# Patient Record
Sex: Female | Born: 1970 | Race: White | Hispanic: No | Marital: Married | Smoking: Former smoker
Health system: Southern US, Community
[De-identification: ages and names within clinical notes are randomized; demographics above are authoritative.]

## PROBLEM LIST (undated history)

## (undated) DIAGNOSIS — T7840XA Allergy, unspecified, initial encounter: Secondary | ICD-10-CM

## (undated) HISTORY — DX: Allergy, unspecified, initial encounter: T78.40XA

## (undated) HISTORY — PX: VAGINAL HYSTERECTOMY: SHX2639

---

## 2004-08-08 ENCOUNTER — Ambulatory Visit: Payer: Self-pay | Admitting: Family Medicine

## 2012-03-12 ENCOUNTER — Ambulatory Visit: Payer: Self-pay | Admitting: Family Medicine

## 2012-04-06 ENCOUNTER — Ambulatory Visit: Payer: Self-pay | Admitting: Urology

## 2013-01-25 ENCOUNTER — Ambulatory Visit: Payer: Self-pay | Admitting: Orthopedic Surgery

## 2013-03-17 ENCOUNTER — Ambulatory Visit: Payer: Self-pay | Admitting: Physician Assistant

## 2013-04-07 ENCOUNTER — Ambulatory Visit: Payer: Self-pay | Admitting: Physician Assistant

## 2013-07-27 ENCOUNTER — Ambulatory Visit: Payer: Self-pay | Admitting: Anesthesiology

## 2013-08-01 ENCOUNTER — Ambulatory Visit: Payer: Self-pay | Admitting: Anesthesiology

## 2013-11-30 DIAGNOSIS — E782 Mixed hyperlipidemia: Secondary | ICD-10-CM | POA: Insufficient documentation

## 2013-11-30 DIAGNOSIS — R079 Chest pain, unspecified: Secondary | ICD-10-CM | POA: Insufficient documentation

## 2014-02-07 ENCOUNTER — Ambulatory Visit: Payer: Self-pay | Admitting: Anesthesiology

## 2014-03-26 IMAGING — MG MM ADDITIONAL VIEWS AT NO CHARGE
1 series · 2 of 2 positions shown · non-contrast
Comparison: 03/17/2013

REASON FOR EXAM: av lt asymmetry
COMMENTS:

PROCEDURE:     MAM - MAM DGTL ADD VW LT  SCR  - April 07, 2013  [DATE]
CLINICAL DATA: Possible left breast mass
EXAM:
MAMMOGRAPHIC ADDITIONAL VIEWS

[L CC · left · 2 of 2 slices shown]
[im 1/2]
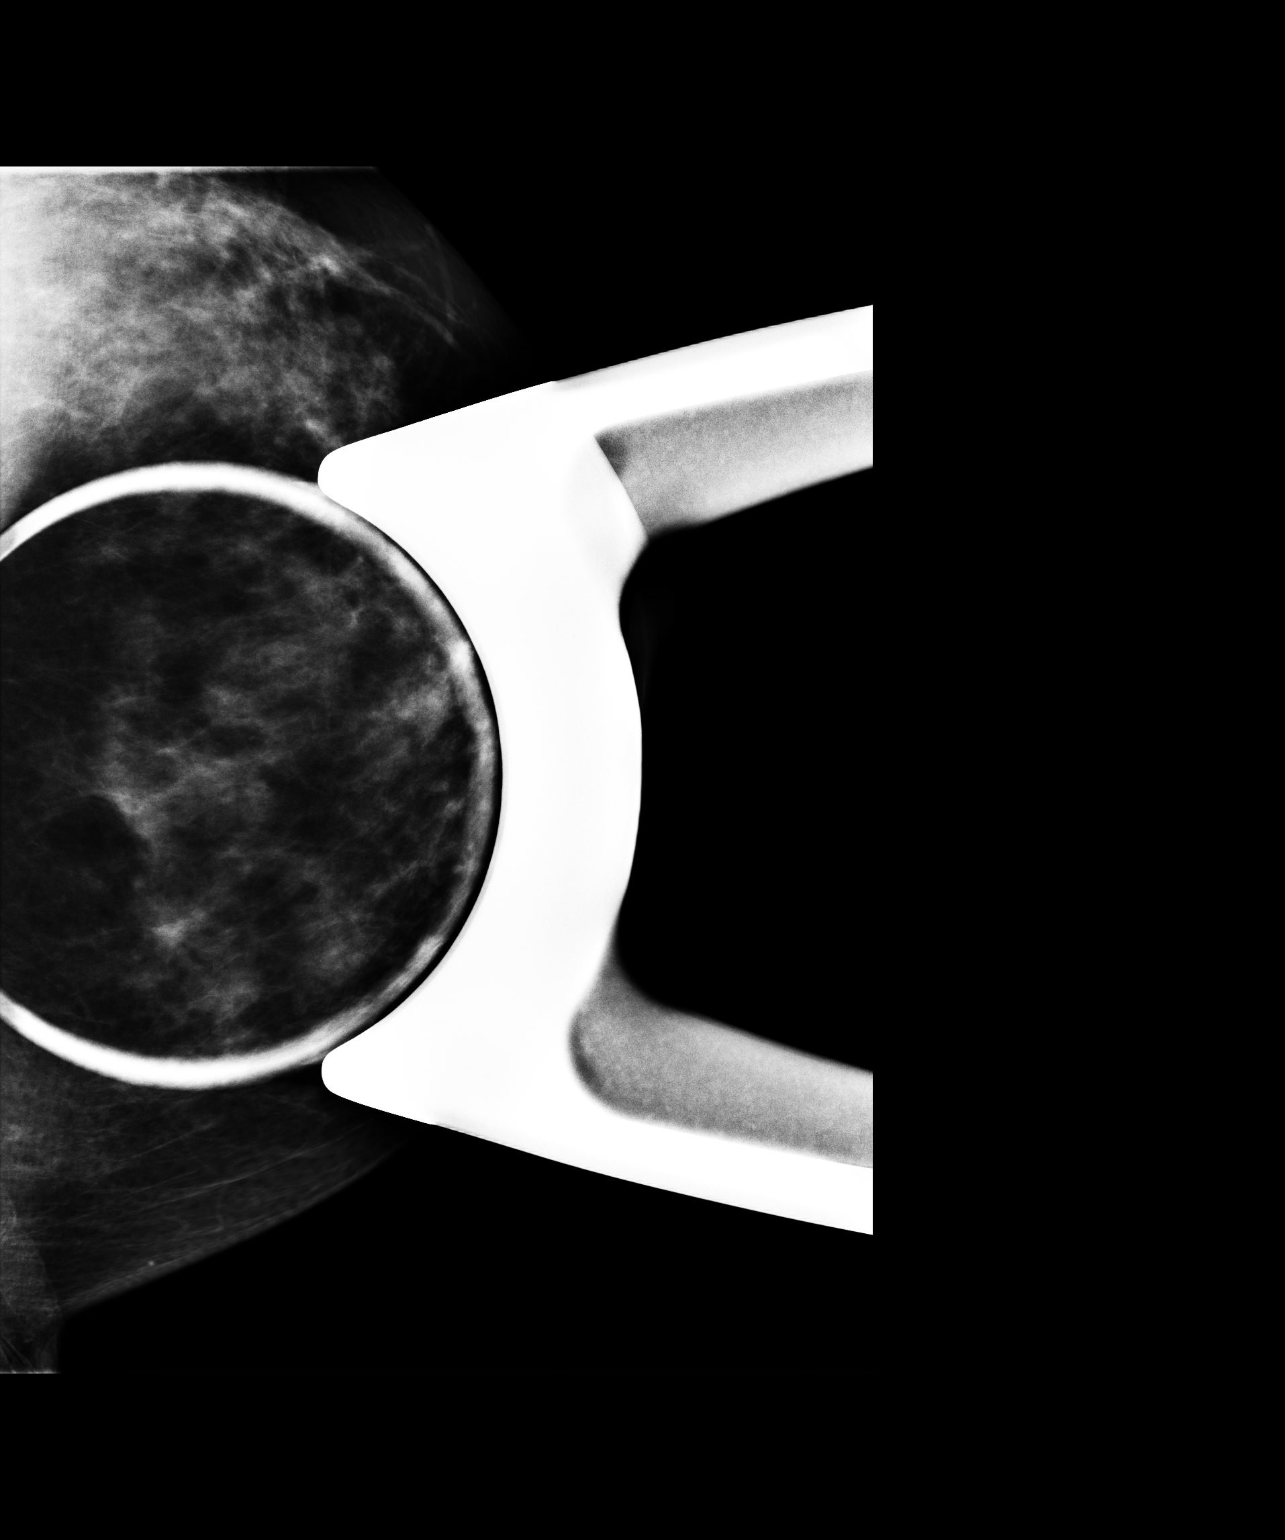
[im 2/2]
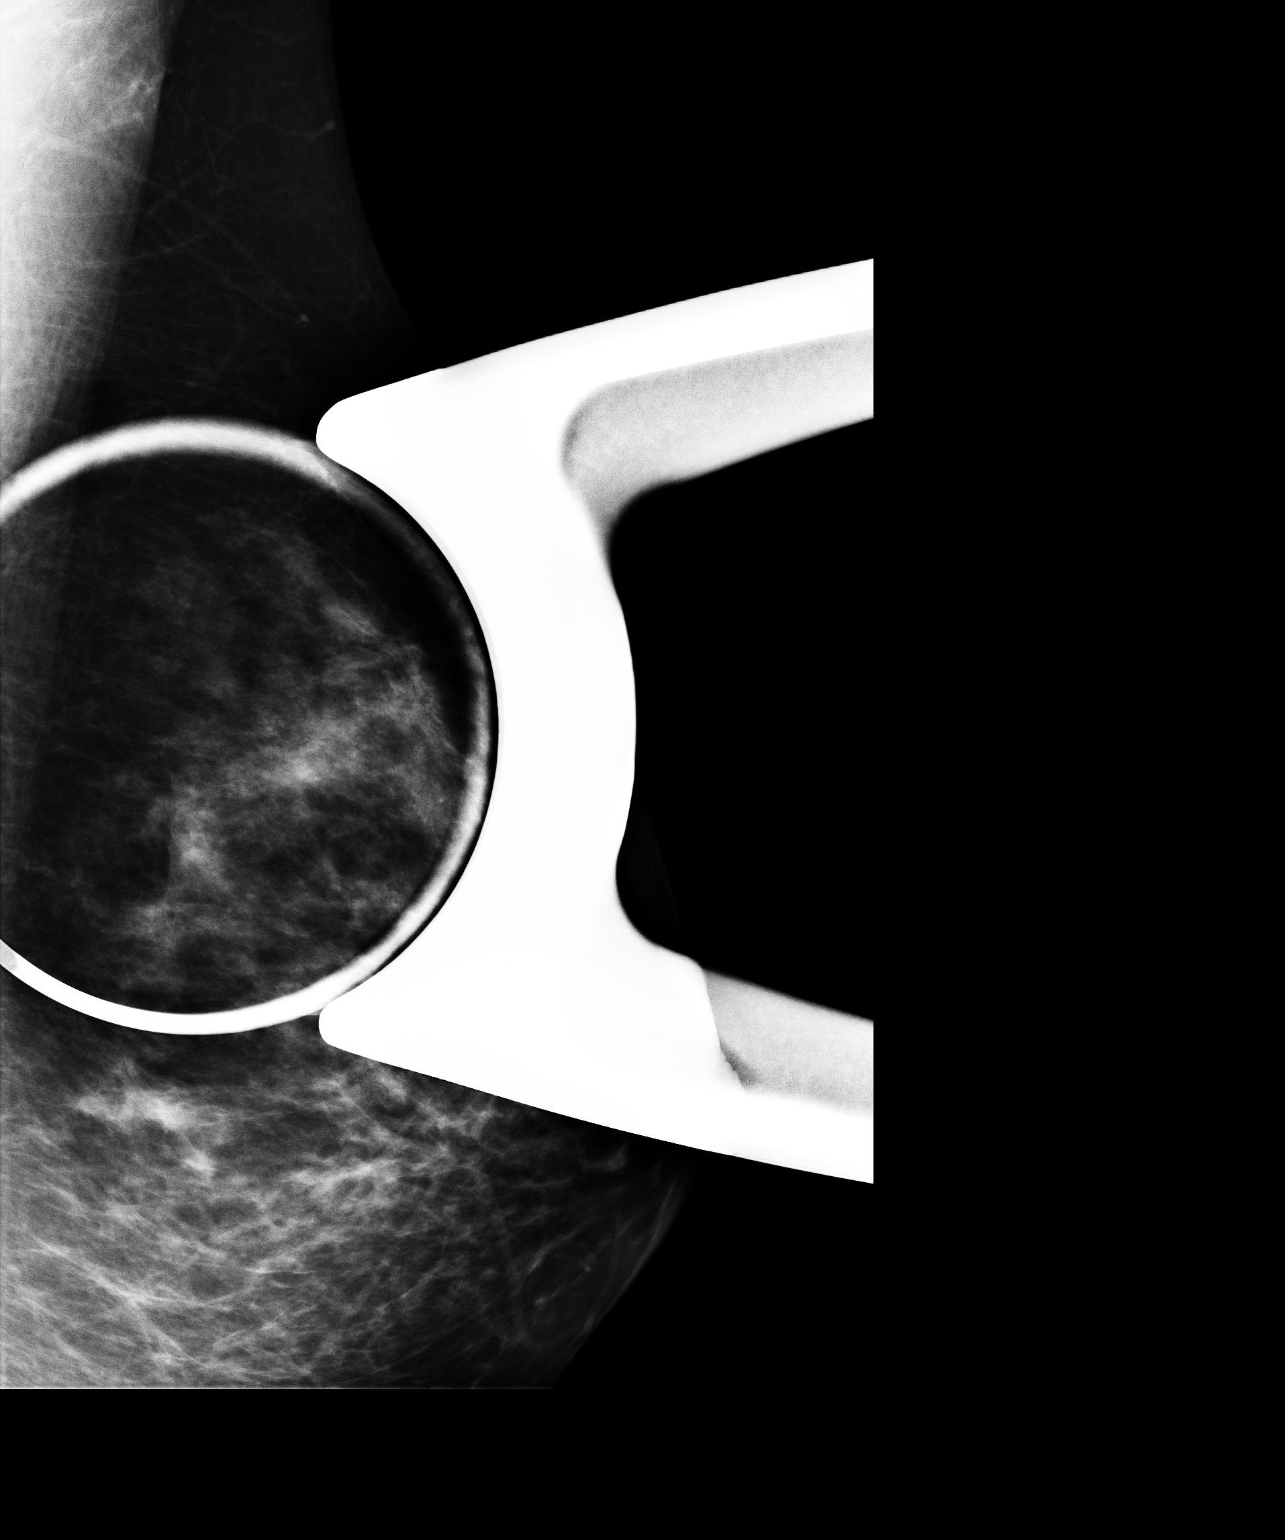

[2 of 2 positions shown; findings below may reference images not displayed]

ACR Breast Density Category c: The breast tissue is heterogeneously
dense, which may obscure small masses.
FINDINGS: No persistent abnormalities on spot compression views
IMPRESSION: Negative

RECOMMENDATION:
Screening mammogram in 1 year

I have discussed the findings and recommendations with the patient.
Results were also provided in writing at the conclusion of the
visit. If applicable, a reminder letter will be sent to the patient
regarding the next appointment.

BI-RADS CATEGORY  1: Negative

## 2014-07-25 ENCOUNTER — Ambulatory Visit: Payer: Self-pay | Admitting: Anesthesiology

## 2014-09-27 ENCOUNTER — Ambulatory Visit
Admit: 2014-09-27 | Disposition: A | Discharge status: Home / Self Care | Payer: Self-pay | Attending: Anesthesiology | Admitting: Anesthesiology
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
## 2014-10-18 ENCOUNTER — Other Ambulatory Visit: Payer: Self-pay | Admitting: Anesthesiology

## 2014-10-18 DIAGNOSIS — M79605 Pain in left leg: Secondary | ICD-10-CM

## 2014-10-18 DIAGNOSIS — M545 Low back pain: Secondary | ICD-10-CM

## 2014-10-18 DIAGNOSIS — M79604 Pain in right leg: Secondary | ICD-10-CM

## 2014-10-24 ENCOUNTER — Telehealth: Payer: Self-pay

## 2014-10-25 ENCOUNTER — Ambulatory Visit
Admission: RE | Admit: 2014-10-25 | Discharge: 2014-10-25 | Disposition: A | Discharge status: Home / Self Care | Payer: BLUE CROSS/BLUE SHIELD | Source: Ambulatory Visit | Attending: Anesthesiology | Admitting: Anesthesiology

## 2014-10-25 DIAGNOSIS — M5127 Other intervertebral disc displacement, lumbosacral region: Secondary | ICD-10-CM | POA: Insufficient documentation

## 2014-10-25 DIAGNOSIS — M545 Low back pain: Secondary | ICD-10-CM | POA: Diagnosis present

## 2014-10-25 DIAGNOSIS — M4696 Unspecified inflammatory spondylopathy, lumbar region: Secondary | ICD-10-CM | POA: Insufficient documentation

## 2014-10-25 DIAGNOSIS — M79605 Pain in left leg: Secondary | ICD-10-CM

## 2015-02-22 ENCOUNTER — Ambulatory Visit (INDEPENDENT_AMBULATORY_CARE_PROVIDER_SITE_OTHER): Payer: BLUE CROSS/BLUE SHIELD | Admitting: Family Medicine

## 2015-02-22 ENCOUNTER — Encounter: Payer: Self-pay | Admitting: Family Medicine

## 2015-02-22 VITALS — BP 120/62 | HR 80 | Ht 66.0 in | Wt 181.0 lb

## 2015-02-22 DIAGNOSIS — M519 Unspecified thoracic, thoracolumbar and lumbosacral intervertebral disc disorder: Secondary | ICD-10-CM

## 2015-02-22 DIAGNOSIS — M5442 Lumbago with sciatica, left side: Secondary | ICD-10-CM | POA: Diagnosis not present

## 2015-02-22 DIAGNOSIS — M4647 Discitis, unspecified, lumbosacral region: Secondary | ICD-10-CM

## 2015-02-22 MED ORDER — TRAMADOL HCL 50 MG PO TABS: 50.0000 mg | ORAL_TABLET | Freq: Three times a day (TID) | ORAL | Status: DC | PRN
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
## 2015-02-22 NOTE — Patient Instructions (Signed)

## 2015-04-27 ENCOUNTER — Encounter: Payer: Self-pay | Admitting: Family Medicine

## 2015-04-27 ENCOUNTER — Ambulatory Visit (INDEPENDENT_AMBULATORY_CARE_PROVIDER_SITE_OTHER): Payer: BLUE CROSS/BLUE SHIELD | Admitting: Family Medicine

## 2015-04-27 VITALS — BP 100/64 | HR 72 | Ht 66.0 in | Wt 176.0 lb

## 2015-04-27 DIAGNOSIS — J40 Bronchitis, not specified as acute or chronic: Secondary | ICD-10-CM

## 2015-04-27 DIAGNOSIS — J0100 Acute maxillary sinusitis, unspecified: Secondary | ICD-10-CM | POA: Diagnosis not present

## 2015-04-27 MED ORDER — AMOXICILLIN-POT CLAVULANATE 875-125 MG PO TABS: 1.0000 | ORAL_TABLET | Freq: Two times a day (BID) | ORAL | Status: DC

## 2015-04-27 MED ORDER — GUAIFENESIN-CODEINE 100-10 MG/5ML PO SOLN: 5.0000 mL | Freq: Three times a day (TID) | ORAL | Status: DC | PRN

## 2015-04-27 NOTE — Progress Notes (Signed)
Name: Ariana Martin   MRN: [id number]    DOB: [birth year]   Date:04/27/2015       Progress Note  Subjective  Chief Complaint  Chief Complaint  Patient presents with  . Sinusitis    cong, stuffy nose, coughing, chills, body aches    Sinusitis This is a new problem. The current episode started in the past 7 days. The problem has been gradually worsening since onset. The maximum temperature recorded prior to her arrival was 100.4 - 100.9 F. The pain is mild. Associated symptoms include chills, congestion, coughing, diaphoresis, headaches, a hoarse voice, neck pain, sinus pressure, sneezing, a sore throat and swollen glands. Pertinent negatives include no ear pain or shortness of breath. Past treatments include acetaminophen and oral decongestants. The treatment provided no relief.  Cough This is a new problem. The current episode started in the past 7 days. The problem has been gradually worsening. Associated symptoms include chills, ear congestion, a fever, headaches, myalgias, nasal congestion, postnasal drip, a sore throat and wheezing. Pertinent negatives include no chest pain, ear pain, heartburn, rash, shortness of breath or weight loss. She has tried OTC cough suppressant for the symptoms. The treatment provided no relief. There is no history of environmental allergies.    No problem-specific assessment & plan notes found for this encounter.   No past medical history on file.  Past Surgical History  Procedure Laterality Date  . Vaginal hysterectomy      No family history on file.  Social History   Social History  . Marital Status: Married    Spouse Name: N/A  . Number of Children: N/A  . Years of Education: N/A   Occupational History  . Not on file.   Social History Main Topics  . Smoking status: Current Every Day Smoker  . Smokeless tobacco: Not on file  . Alcohol Use: 0.0 oz/week    0 Standard drinks or equivalent per week  . Drug Use: No  . Sexual Activity: Not on  file   Other Topics Concern  . Not on file   Social History Narrative    Allergies  Allergen Reactions  . Sulfa Antibiotics Itching and Rash     Review of Systems  Constitutional: Positive for fever, chills and diaphoresis. Negative for weight loss and malaise/fatigue.  HENT: Positive for congestion, hoarse voice, postnasal drip, sinus pressure, sneezing and sore throat. Negative for ear discharge and ear pain.   Eyes: Negative for blurred vision.  Respiratory: Positive for cough and wheezing. Negative for sputum production and shortness of breath.   Cardiovascular: Negative for chest pain, palpitations and leg swelling.  Gastrointestinal: Negative for heartburn, nausea, abdominal pain, diarrhea, constipation, blood in stool and melena.  Genitourinary: Negative for dysuria, urgency, frequency and hematuria.  Musculoskeletal: Positive for myalgias and neck pain. Negative for back pain and joint pain.  Skin: Negative for rash.  Neurological: Positive for headaches. Negative for dizziness, tingling, sensory change and focal weakness.  Endo/Heme/Allergies: Negative for environmental allergies and polydipsia. Does not bruise/bleed easily.  Psychiatric/Behavioral: Negative for depression and suicidal ideas. The patient is not nervous/anxious and does not have insomnia.      Objective  Filed Vitals:   04/27/15 1531  BP: 100/64  Pulse: 72  Height: 5\' 6"  (1.676 m)  Weight: 176 lb (79.833 kg)    Physical Exam  Constitutional: She is well-developed, well-nourished, and in no distress. No distress.  HENT:  Head: Normocephalic and atraumatic.  Right Ear: External ear normal.  Left Ear: External ear normal.  Nose: Nose normal.  Mouth/Throat: Oropharynx is clear and moist.  Eyes: Conjunctivae and EOM are normal. Pupils are equal, round, and reactive to light. Right eye exhibits no discharge. Left eye exhibits no discharge.  Neck: Normal range of motion. Neck supple. No JVD present.  No thyromegaly present.  Cardiovascular: Normal rate, regular rhythm, normal heart sounds and intact distal pulses.  Exam reveals no gallop and no friction rub.   No murmur heard. Pulmonary/Chest: Effort normal and breath sounds normal.  Abdominal: Soft. Bowel sounds are normal. She exhibits no mass. There is no tenderness. There is no guarding.  Musculoskeletal: Normal range of motion. She exhibits no edema.  Lymphadenopathy:    She has no cervical adenopathy.  Neurological: She is alert.  Skin: Skin is warm and dry. She is not diaphoretic.  Psychiatric: Mood and affect normal.      Assessment & Plan  Problem List Items Addressed This Visit    None    Visit Diagnoses    Acute maxillary sinusitis, recurrence not specified    -  Primary    Relevant Medications    amoxicillin-clavulanate (AUGMENTIN) 875-125 MG tablet    guaiFENesin-codeine 100-10 MG/5ML syrup    Bronchitis             Dr. [facility]  04/27/2015

## 2015-11-08 ENCOUNTER — Encounter: Payer: Self-pay | Admitting: Family Medicine

## 2015-11-08 ENCOUNTER — Ambulatory Visit (INDEPENDENT_AMBULATORY_CARE_PROVIDER_SITE_OTHER): Payer: BLUE CROSS/BLUE SHIELD | Admitting: Family Medicine

## 2015-11-08 VITALS — BP 120/80 | HR 80 | Ht 66.0 in | Wt 180.0 lb

## 2015-11-08 DIAGNOSIS — M1990 Unspecified osteoarthritis, unspecified site: Secondary | ICD-10-CM

## 2015-11-08 DIAGNOSIS — M722 Plantar fascial fibromatosis: Secondary | ICD-10-CM | POA: Diagnosis not present

## 2015-11-08 MED ORDER — MELOXICAM 15 MG PO TABS: 15.0000 mg | ORAL_TABLET | Freq: Every day | ORAL | Status: DC

## 2015-11-08 NOTE — Patient Instructions (Signed)
Rheumatoid Arthritis  Rheumatoid arthritis is a long-term (chronic) inflammatory disease that causes pain, swelling, and stiffness of the joints. It can affect the entire body, including the eyes and lungs. The effects of rheumatoid arthritis vary widely among those with the condition.  CAUSES  The cause of rheumatoid arthritis is not known. It tends to run in families and is more common in women. Certain cells of the body's natural defense system (immune system) do not work properly and begin to attack healthy joints. It primarily involves the connective tissue that lines the joints (synovial membrane). This can cause damage to the joint.  SYMPTOMS  · Pain, stiffness, swelling, and decreased motion of many joints, especially in the hands and feet.  · Stiffness that is worse in the morning. It may last 1-2 hours or longer.  · Numbness and tingling in the hands.  · Fatigue.  · Loss of appetite.  · Weight loss.  · Low-grade fever.  · Dry eyes and mouth.  · Firm lumps (rheumatoid nodules) that grow beneath the skin in areas such as the elbows and hands.  DIAGNOSIS  Diagnosis is based on the symptoms described, an exam, and blood tests. Sometimes, X-rays are helpful.  TREATMENT  The goals of treatment are to relieve pain, reduce inflammation, and to slow down or stop joint damage and disability. Methods vary and may include:  · Maintaining a balance of rest, exercise, and proper nutrition.  · Your health care provider may adjust your medicines every 3 months until treatment goals are reached. Common medicines include:    Pain relievers (analgesics).    Corticosteroids and nonsteroidal anti-inflammatory drugs (NSAIDs) to reduce inflammation.    Disease-modifying antirheumatic drugs (DMARDs) to try to slow the course of the disease.    Biologic response modifiers to reduce inflammation and damage.  · Physical therapy and occupational therapy.  · Surgery for patients with severe joint damage. Joint replacement or fusing of  joints may be needed.  · Routine monitoring and ongoing care, such as office visits, blood and urine tests, and X-rays.  Your health care provider will work with you to identify the best treatment option for you, based on an assessment of the overall disease activity in your body.  HOME CARE INSTRUCTIONS  · Remain physically active and reduce activity when the disease gets worse.  · Eat a well-balanced diet.  · Put heat on affected joints when you wake up and before activities. Keep the heat on the affected joint for as long as directed by your health care provider.  · Put ice on affected joints following activities or exercising.    Put ice in a plastic bag.    Place a towel between your skin and the bag.    Leave the ice on for 15-20 minutes, 3-4 times per day, or as directed by your health care provider.  · Take medicines and supplements only as directed by your health care provider.  · Use splints as directed by your health care provider. Splints help maintain joint position and function.  · Do not sleep with pillows under your knees. This may lead to spasms.  · Participate in a self-management program to keep current with the latest treatment and coping skills.  SEEK IMMEDIATE MEDICAL CARE IF:  · You have fainting episodes.  · You have periods of extreme weakness.  · You rapidly develop a hot, painful joint that is more severe than usual joint aches.  · You have chills.  ·   Released: 05/30/2000 Document Revised: 06/23/2014 Document Reviewed: 07/09/2011 Elsevier Interactive Patient Education 2016 [employer] Plantar Fasciitis Plantar fasciitis is a painful foot condition that affects  the heel. It occurs when the band of tissue that connects the toes to the heel bone (plantar fascia) becomes irritated. This can happen after exercising too much or doing other repetitive activities (overuse injury). The pain from plantar fasciitis can range from mild irritation to severe pain that makes it difficult for you to walk or move. The pain is usually worse in the morning or after you have been sitting or lying down for a while. CAUSES This condition may be caused by:  Standing for long periods of time.  Wearing shoes that do not fit.  Doing high-impact activities, including running, aerobics, and ballet.  Being overweight.  Having an abnormal way of walking (gait).  Having tight calf muscles.  Having high arches in your feet.  Starting a new athletic activity. SYMPTOMS The main symptom of this condition is heel pain. Other symptoms include:  Pain that gets worse after activity or exercise.  Pain that is worse in the morning or after resting.  Pain that goes away after you walk for a few minutes. DIAGNOSIS This condition may be diagnosed based on your signs and symptoms. Your health care provider will also do a physical exam to check for:  A tender area on the bottom of your foot.  A high arch in your foot.  Pain when you move your foot.  Difficulty moving your foot. You may also need to have imaging studies to confirm the diagnosis. These can include:  X-rays.  Ultrasound.  MRI. TREATMENT  Treatment for plantar fasciitis depends on the severity of the condition. Your treatment may include:  Rest, ice, and over-the-counter pain medicines to manage your pain.  Exercises to stretch your calves and your plantar fascia.  A splint that holds your foot in a stretched, upward position while you sleep (night splint).  Physical therapy to relieve symptoms and prevent problems in the future.  Cortisone injections to relieve severe pain.  Extracorporeal shock  wave therapy (ESWT) to stimulate damaged plantar fascia with electrical impulses. It is often used as a last resort before surgery.  Surgery, if other treatments have not worked after 12 months. HOME CARE INSTRUCTIONS  Take medicines only as directed by your health care provider.  Avoid activities that cause pain.  Roll the bottom of your foot over a bag of ice or a bottle of cold water. Do this for 20 minutes, 3-4 times a day.  Perform simple stretches as directed by your health care provider.  Try wearing athletic shoes with air-sole or gel-sole cushions or soft shoe inserts.  Wear a night splint while sleeping, if directed by your health care provider.  Keep all follow-up appointments with your health care provider. PREVENTION   Do not perform exercises or activities that cause heel pain.  Consider finding low-impact activities if you continue to have problems.  Lose weight if you need to. The best way to prevent plantar fasciitis is to avoid the activities that aggravate your plantar fascia. SEEK MEDICAL CARE IF:  Your symptoms do not go away after treatment with home care measures.  Your pain gets worse.  Your pain affects your ability to move or do your daily activities.   This information is not intended to replace advice given to you by your health care provider. Make sure you discuss any questions you have with  your health care provider.   Document Released: 02/25/2001 Document Revised: 02/21/2015 Document Reviewed: 04/12/2014 Elsevier Interactive Patient Education [employer].

## 2015-11-08 NOTE — Progress Notes (Signed)
Name: Ariana Martin   MRN: [id number]    DOB: Feb 09, 1971   Date:11/08/2015       Progress Note  Subjective  Chief Complaint  Chief Complaint  Patient presents with  . Foot Pain    bilateral pain but worse on R) foot- hurts on bottom of heel and is worse when getting up in the morning  . Hand Pain    bilateral hand pain- has noticed "knots coming up on sides of fingers"    Foot Pain This is a new problem. The current episode started more than 1 month ago. The problem has been waxing and waning. Pertinent negatives include no abdominal pain, chest pain, chills, coughing, fatigue, fever, headaches, myalgias, nausea, neck pain, rash, sore throat or swollen glands. The symptoms are aggravated by standing and walking (esp AM). She has tried NSAIDs for the symptoms. The treatment provided mild relief.  Hand Pain  The incident occurred more than 1 week ago. There was no injury mechanism. The pain is present in the left fingers and right fingers. The quality of the pain is described as aching. The pain is moderate. The pain has been improving since the incident. Pertinent negatives include no chest pain or tingling. The symptoms are aggravated by movement. She has tried NSAIDs for the symptoms. The treatment provided mild relief.    No problem-specific assessment & plan notes found for this encounter.   History reviewed. No pertinent past medical history.  Past Surgical History  Procedure Laterality Date  . Vaginal hysterectomy      History reviewed. No pertinent family history.  Social History   Social History  . Marital Status: Married    Spouse Name: N/A  . Number of Children: N/A  . Years of Education: N/A   Occupational History  . Not on file.   Social History Main Topics  . Smoking status: Current Every Day Smoker  . Smokeless tobacco: Not on file  . Alcohol Use: 0.0 oz/week    0 Standard drinks or equivalent per week  . Drug Use: No  . Sexual Activity: Not on file    Other Topics Concern  . Not on file   Social History Narrative    Allergies  Allergen Reactions  . Sulfa Antibiotics Itching and Rash     Review of Systems  Constitutional: Negative for fever, chills, weight loss, malaise/fatigue and fatigue.  HENT: Negative for ear discharge, ear pain and sore throat.   Eyes: Negative for blurred vision.  Respiratory: Negative for cough, sputum production, shortness of breath and wheezing.   Cardiovascular: Negative for chest pain, palpitations and leg swelling.  Gastrointestinal: Negative for heartburn, nausea, abdominal pain, diarrhea, constipation, blood in stool and melena.  Genitourinary: Negative for dysuria, urgency, frequency and hematuria.  Musculoskeletal: Negative for myalgias, back pain, joint pain and neck pain.  Skin: Negative for rash.  Neurological: Negative for dizziness, tingling, sensory change, focal weakness and headaches.  Endo/Heme/Allergies: Negative for environmental allergies and polydipsia. Does not bruise/bleed easily.  Psychiatric/Behavioral: Negative for depression and suicidal ideas. The patient is not nervous/anxious and does not have insomnia.      Objective  Filed Vitals:   11/08/15 0817  BP: 120/80  Pulse: 80  Height: _0  (1.676 m)  Weight: 180 lb (81.647 kg)    Physical Exam  Constitutional: She is well-developed, well-nourished, and in no distress. No distress.  HENT:  Head: Normocephalic and atraumatic.  Right Ear: Tympanic membrane, external ear and ear canal normal.  Left Ear: Tympanic membrane, external ear and ear canal normal.  Nose: Nose normal.  Mouth/Throat: Oropharynx is clear and moist.  Eyes: Conjunctivae and EOM are normal. Pupils are equal, round, and reactive to light. Right eye exhibits no discharge. Left eye exhibits no discharge.  Neck: Normal range of motion. Neck supple. No JVD present. No thyromegaly present.  Cardiovascular: Normal rate, regular rhythm, normal heart  sounds and intact distal pulses.  Exam reveals no gallop and no friction rub.   No murmur heard. Pulmonary/Chest: Effort normal and breath sounds normal.  Abdominal: Soft. Bowel sounds are normal. She exhibits no mass. There is no tenderness. There is no guarding.  Musculoskeletal: Normal range of motion. She exhibits no edema.       Right wrist: She exhibits tenderness.       Left wrist: She exhibits tenderness.       Right hand: She exhibits tenderness and swelling.       Left hand: She exhibits tenderness and swelling.  nodularity  Lymphadenopathy:    She has no cervical adenopathy.  Neurological: She is alert. She has normal reflexes.  Skin: Skin is warm and dry. She is not diaphoretic.  Psychiatric: Mood and affect normal.  Nursing note and vitals reviewed.     Assessment & Plan  Problem List Items Addressed This Visit    None    Visit Diagnoses    Arthritis    -  Primary    Relevant Medications    meloxicam (MOBIC) 15 MG tablet    Other Relevant Orders    Sed Rate (ESR)    Rheumatoid Factor    Plantar fasciitis, bilateral        Relevant Medications    meloxicam (MOBIC) 15 MG tablet         Dr. Deanna Jones [facility]  11/08/2015

## 2015-11-09 LAB — SEDIMENTATION RATE: Sed Rate: 5 mm/hr (ref 0–32)

## 2015-11-09 LAB — RHEUMATOID FACTOR: Rhuematoid fact SerPl-aCnc: <10 IU/mL (ref 0.0–13.9)

## 2016-03-27 ENCOUNTER — Other Ambulatory Visit: Payer: Self-pay

## 2016-04-01 ENCOUNTER — Encounter: Payer: Self-pay | Admitting: Family Medicine

## 2016-04-01 ENCOUNTER — Ambulatory Visit (INDEPENDENT_AMBULATORY_CARE_PROVIDER_SITE_OTHER): Payer: BLUE CROSS/BLUE SHIELD | Admitting: Family Medicine

## 2016-04-01 VITALS — BP 118/70 | HR 60 | Ht 66.0 in | Wt 182.0 lb

## 2016-04-01 DIAGNOSIS — Z01818 Encounter for other preprocedural examination: Secondary | ICD-10-CM | POA: Diagnosis not present

## 2016-04-01 DIAGNOSIS — J029 Acute pharyngitis, unspecified: Secondary | ICD-10-CM

## 2016-04-01 DIAGNOSIS — K219 Gastro-esophageal reflux disease without esophagitis: Secondary | ICD-10-CM | POA: Diagnosis not present

## 2016-04-01 DIAGNOSIS — Z23 Encounter for immunization: Secondary | ICD-10-CM | POA: Diagnosis not present

## 2016-04-01 MED ORDER — AZITHROMYCIN 250 MG PO TABS: ORAL_TABLET | ORAL | 0 refills | Status: DC

## 2016-04-01 MED ORDER — PANTOPRAZOLE SODIUM 40 MG PO TBEC
40.0000 mg | DELAYED_RELEASE_TABLET | Freq: Every day | ORAL | 3 refills | Status: DC
Start: 2016-04-01 — End: 2016-08-02

## 2016-04-01 NOTE — Progress Notes (Signed)
Name: Ariana Martin   MRN: [id number]    DOB: Dec 20, 1970   Date:04/01/2016       Progress Note  Subjective  Chief Complaint  Chief Complaint  Patient presents with  . Pre-op Exam    no palpatations or chest pain. surgical clearance for back.    Patient presents for surgical clearance.    No problem-specific Assessment & Plan notes found for this encounter.   History reviewed. No pertinent past medical history.  Past Surgical History:  Procedure Laterality Date  . VAGINAL HYSTERECTOMY      History reviewed. No pertinent family history.  Social History   Social History  . Marital status: Married    Spouse name: N/A  . Number of children: N/A  . Years of education: N/A   Occupational History  . Not on file.   Social History Main Topics  . Smoking status: Current Every Day Smoker  . Smokeless tobacco: Not on file  . Alcohol use 0.0 oz/week  . Drug use: No  . Sexual activity: Not on file   Other Topics Concern  . Not on file   Social History Narrative  . No narrative on file    Allergies  Allergen Reactions  . Sulfa Antibiotics Itching and Rash     Review of Systems  Constitutional: Negative for chills, fever, malaise/fatigue and weight loss.  HENT: Positive for sore throat. Negative for ear discharge and ear pain.   Eyes: Negative for blurred vision.  Respiratory: Negative for cough, sputum production, shortness of breath and wheezing.   Cardiovascular: Negative for chest pain, palpitations and leg swelling.  Gastrointestinal: Positive for heartburn. Negative for abdominal pain, blood in stool, constipation, diarrhea, melena and nausea.  Genitourinary: Negative for dysuria, frequency, hematuria and urgency.  Musculoskeletal: Negative for back pain, joint pain, myalgias and neck pain.  Skin: Negative for rash.  Neurological: Negative for dizziness, tingling, sensory change, focal weakness and headaches.  Endo/Heme/Allergies: Negative for environmental  allergies and polydipsia. Does not bruise/bleed easily.  Psychiatric/Behavioral: Negative for depression and suicidal ideas. The patient is not nervous/anxious and does not have insomnia.      Objective  Vitals:   04/01/16 1536  BP: 118/70  Pulse: 60  Weight: 182 lb (82.6 kg)  Height: 5\' 6"  (1.676 m)    Physical Exam  Constitutional: She is well-developed, well-nourished, and in no distress. No distress.  HENT:  Head: Normocephalic and atraumatic.  Right Ear: External ear normal.  Left Ear: External ear normal.  Nose: Nose normal.  Mouth/Throat: Oropharynx is clear and moist.  Eyes: Conjunctivae and EOM are normal. Pupils are equal, round, and reactive to light. Right eye exhibits no discharge. Left eye exhibits no discharge.  Neck: Normal range of motion. Neck supple. No JVD present. No thyromegaly present.  Cardiovascular: Normal rate, regular rhythm, normal heart sounds and intact distal pulses.  Exam reveals no gallop and no friction rub.   No murmur heard. Pulmonary/Chest: Effort normal and breath sounds normal. She has no wheezes. She has no rales.  Abdominal: Soft. Bowel sounds are normal. She exhibits no mass. There is no tenderness. There is no guarding.  Musculoskeletal: Normal range of motion. She exhibits no edema.  Lymphadenopathy:    She has no cervical adenopathy.  Neurological: She is alert. She has normal reflexes.  Skin: Skin is warm and dry. She is not diaphoretic.  Psychiatric: Mood and affect normal.  Nursing note and vitals reviewed.     Assessment & Plan  Problem List Items Addressed This Visit    None    Visit Diagnoses    Preoperative clearance    -  Primary   medications reviewed/medical conditions reviewed/ no contraindications to anesthesia nor lumbar spine surgery.   Pharyngitis, unspecified etiology       Gastroesophageal reflux disease, esophagitis presence not specified       Relevant Medications   pantoprazole (PROTONIX) 40 MG tablet    Flu vaccine need       Relevant Orders   Flu Vaccine QUAD 36+ mos PF IM (Fluarix & Fluzone Quad PF) (Completed)     Proceed with surgery without concerns   Dr. Hayden [facility]  04/01/16

## 2016-08-02 ENCOUNTER — Other Ambulatory Visit: Payer: Self-pay | Admitting: Family Medicine

## 2016-08-29 ENCOUNTER — Encounter: Payer: Self-pay | Admitting: Podiatry

## 2016-09-18 NOTE — Progress Notes (Signed)
This encounter was created in error - please disregard.

## 2016-10-11 ENCOUNTER — Other Ambulatory Visit: Payer: Self-pay | Admitting: Family Medicine

## 2016-11-07 ENCOUNTER — Other Ambulatory Visit: Payer: Self-pay

## 2016-11-07 DIAGNOSIS — K219 Gastro-esophageal reflux disease without esophagitis: Secondary | ICD-10-CM

## 2016-11-07 MED ORDER — PANTOPRAZOLE SODIUM 40 MG PO TBEC: 40.0000 mg | DELAYED_RELEASE_TABLET | Freq: Every day | ORAL | 0 refills | Status: AC

## 2016-11-09 ENCOUNTER — Other Ambulatory Visit: Payer: Self-pay | Admitting: Family Medicine

## 2016-11-20 ENCOUNTER — Other Ambulatory Visit: Payer: Self-pay

## 2017-01-14 DIAGNOSIS — Z1231 Encounter for screening mammogram for malignant neoplasm of breast: Secondary | ICD-10-CM | POA: Diagnosis not present

## 2017-01-14 DIAGNOSIS — Z9289 Personal history of other medical treatment: Secondary | ICD-10-CM | POA: Diagnosis not present

## 2017-01-14 DIAGNOSIS — N649 Disorder of breast, unspecified: Secondary | ICD-10-CM | POA: Diagnosis not present

## 2017-01-28 DIAGNOSIS — R928 Other abnormal and inconclusive findings on diagnostic imaging of breast: Secondary | ICD-10-CM | POA: Diagnosis not present

## 2017-01-28 DIAGNOSIS — N6311 Unspecified lump in the right breast, upper outer quadrant: Secondary | ICD-10-CM | POA: Diagnosis not present

## 2017-02-05 DIAGNOSIS — M5137 Other intervertebral disc degeneration, lumbosacral region: Secondary | ICD-10-CM | POA: Diagnosis not present

## 2017-02-05 DIAGNOSIS — M5136 Other intervertebral disc degeneration, lumbar region: Secondary | ICD-10-CM | POA: Diagnosis not present

## 2017-02-05 DIAGNOSIS — Z981 Arthrodesis status: Secondary | ICD-10-CM | POA: Diagnosis not present

## 2017-06-04 DIAGNOSIS — M5137 Other intervertebral disc degeneration, lumbosacral region: Secondary | ICD-10-CM | POA: Diagnosis not present

## 2017-06-04 DIAGNOSIS — M4186 Other forms of scoliosis, lumbar region: Secondary | ICD-10-CM | POA: Diagnosis not present

## 2017-06-04 DIAGNOSIS — M5136 Other intervertebral disc degeneration, lumbar region: Secondary | ICD-10-CM | POA: Diagnosis not present

## 2017-06-04 DIAGNOSIS — Z981 Arthrodesis status: Secondary | ICD-10-CM | POA: Diagnosis not present

## 2017-07-16 DIAGNOSIS — R208 Other disturbances of skin sensation: Secondary | ICD-10-CM | POA: Diagnosis not present

## 2017-07-16 DIAGNOSIS — I83893 Varicose veins of bilateral lower extremities with other complications: Secondary | ICD-10-CM | POA: Diagnosis not present

## 2017-08-18 ENCOUNTER — Other Ambulatory Visit (INDEPENDENT_AMBULATORY_CARE_PROVIDER_SITE_OTHER): Payer: Self-pay | Admitting: Vascular Surgery

## 2017-08-18 DIAGNOSIS — I8312 Varicose veins of left lower extremity with inflammation: Secondary | ICD-10-CM

## 2017-08-20 ENCOUNTER — Ambulatory Visit (INDEPENDENT_AMBULATORY_CARE_PROVIDER_SITE_OTHER): Payer: BLUE CROSS/BLUE SHIELD

## 2017-08-20 ENCOUNTER — Encounter (INDEPENDENT_AMBULATORY_CARE_PROVIDER_SITE_OTHER): Payer: Self-pay | Admitting: Vascular Surgery

## 2017-08-20 ENCOUNTER — Ambulatory Visit (INDEPENDENT_AMBULATORY_CARE_PROVIDER_SITE_OTHER): Payer: BLUE CROSS/BLUE SHIELD | Admitting: Vascular Surgery

## 2017-08-20 VITALS — BP 112/65 | HR 62 | Resp 18 | Ht 66.0 in | Wt 173.0 lb

## 2017-08-20 DIAGNOSIS — I8312 Varicose veins of left lower extremity with inflammation: Secondary | ICD-10-CM

## 2017-08-20 DIAGNOSIS — I872 Venous insufficiency (chronic) (peripheral): Secondary | ICD-10-CM | POA: Diagnosis not present

## 2017-08-20 DIAGNOSIS — E782 Mixed hyperlipidemia: Secondary | ICD-10-CM | POA: Diagnosis not present

## 2017-08-20 DIAGNOSIS — M519 Unspecified thoracic, thoracolumbar and lumbosacral intervertebral disc disorder: Secondary | ICD-10-CM

## 2017-08-20 DIAGNOSIS — I83813 Varicose veins of bilateral lower extremities with pain: Secondary | ICD-10-CM | POA: Diagnosis not present

## 2017-08-20 NOTE — Progress Notes (Signed)
MRN : [id number]  Ariana Martin is a 47 y.o. (1971/02/28) female who presents with chief complaint of  Chief Complaint  Patient presents with  . New Patient (Initial Visit)    ref Gwen PoundsKowalski for Varicose veins  .  History of Present Illness: The patient is seen for evaluation of symptomatic varicose veins. The patient relates burning and stinging which worsened steadily throughout the course of the day, particularly with standing. The patient also notes an aching and throbbing pain over the varicosities, particularly with prolonged dependent positions. The symptoms are significantly improved with elevation.  The patient also notes that during hot weather the symptoms are greatly intensified. The patient states the pain from the varicose veins interferes with work, daily exercise, shopping and household maintenance. At this point, the symptoms are persistent and severe enough that they're having a negative impact on lifestyle and are interfering with daily activities.  There is no history of DVT, PE or superficial thrombophlebitis. There is no history of ulceration or hemorrhage. The patient denies a significant family history of varicose veins. OB history: P2  The patient has not worn graduated compression in the past. At the present time the patient has not been using over-the-counter analgesics. There is no history of prior surgical intervention or sclerotherapy.    Current Meds  Medication Sig  . Cetirizine HCl (ZYRTEC ALLERGY) 10 MG CAPS Take by mouth.  . EPINEPHrine 0.3 mg/0.3 mL IJ SOAJ injection Inject 0.3 mg into the muscle.  . naproxen sodium (ALEVE) 220 MG tablet Take by mouth.    Past Medical History:  Diagnosis Date  . Allergy     Past Surgical History:  Procedure Laterality Date  . VAGINAL HYSTERECTOMY      Social History Social History   Tobacco Use  . Smoking status: Current Every Day Smoker  . Smokeless tobacco: Never Used  Substance Use Topics  . Alcohol  use: Yes    Alcohol/week: 0.0 oz  . Drug use: No    Family History Family History  Problem Relation Age of Onset  . Hypertension Mother   . Hyperlipidemia Mother   . Vascular Disease Father   . Heart disease Father     Allergies  Allergen Reactions  . Sulfa Antibiotics Itching and Rash     REVIEW OF SYSTEMS (Negative unless checked)  Constitutional: [] Weight loss  [] Fever  [] Chills Cardiac: [] Chest pain   [] Chest pressure   [] Palpitations   [] Shortness of breath when laying flat   [] Shortness of breath with exertion. Vascular:  [] Pain in legs with walking   [x] Pain in legs with standing  [] History of DVT   [] Phlebitis   [] Swelling in legs   [x] Varicose veins   [] Non-healing ulcers Pulmonary:   [] Uses home oxygen   [] Productive cough   [] Hemoptysis   [] Wheeze  [] COPD   [] Asthma Neurologic:  [] Dizziness   [] Seizures   [] History of stroke   [] History of TIA  [] Aphasia   [] Vissual changes   [] Weakness or numbness in arm   [] Weakness or numbness in leg Musculoskeletal:   [] Joint swelling   [] Joint pain   [x] Low back pain Hematologic:  [] Easy bruising  [] Easy bleeding   [] Hypercoagulable state   [] Anemic Gastrointestinal:  [] Diarrhea   [] Vomiting  [] Gastroesophageal reflux/heartburn   [] Difficulty swallowing. Genitourinary:  [] Chronic kidney disease   [] Difficult urination  [] Frequent urination   [] Blood in urine Skin:  [] Rashes   [] Ulcers  Psychological:  [] History of anxiety   []  History  of major depression.  Physical Examination  Vitals:   08/20/17 1156  BP: 112/65  Pulse: 62  Resp: 18  Weight: 173 lb (78.5 kg)  Height: 5\' 6"  (1.676 m)   Body mass index is 27.92 kg/m. Gen: WD/WN, NAD Head: [place]/AT, No temporalis wasting.  Ear/Nose/Throat: Hearing grossly intact, nares w/o erythema or drainage Eyes: PER, EOMI, sclera nonicteric.  Neck: Supple, no large masses.   Pulmonary:  Good air movement, no audible wheezing bilaterally, no use of accessory muscles.  Cardiac: RRR, no  JVD Vascular: scattered varicosities present bilaterally.  Mild venous stasis changes to the legs bilaterally.  trace soft pitting edema Vessel Right Left  Radial Palpable Palpable  PT Palpable Palpable  DP Palpable Palpable  Gastrointestinal: Non-distended. No guarding/no peritoneal signs.  Musculoskeletal: M/S 5/5 throughout.  No deformity or atrophy.  Neurologic: CN 2-12 intact. Symmetrical.  Speech is fluent. Motor exam as listed above. Psychiatric: Judgment intact, Mood & affect appropriate for pt's clinical situation. Dermatologic: Mild venous rashes no ulcers noted.  No changes consistent with cellulitis. Lymph : No lichenification or skin changes of chronic lymphedema.  CBC No results found for: WBC, HGB, HCT, MCV, PLT  BMET No results found for: NA, K, CL, CO2, GLUCOSE, BUN, CREATININE, CALCIUM, GFRNONAA, GFRAA CrCl cannot be calculated (No order found.).  COAG No results found for: INR, PROTIME  Radiology No results found.  Assessment/Plan 1. Varicose veins of both lower extremities with pain  Recommend:  The patient has large symptomatic varicose veins that are painful and associated with swelling.  I have had a long discussion with the patient regarding  varicose veins and why they cause symptoms.  Patient will begin wearing graduated compression stockings class 1 on a daily basis, beginning first thing in the morning and removing them in the evening. The patient is instructed specifically not to sleep in the stockings.    The patient  will also begin using over-the-counter analgesics such as Motrin 600 mg po TID to help control the symptoms.    In addition, behavioral modification including elevation during the day will be initiated.    Pending the results of these changes the  patient will be reevaluated in three months.     Ultrasound of the venous system shows reflux in the GSV bilateral with the GSV measuring 6-7 mm, mild reflux in the right SSV but the vein  is only 3 mm.   Further plans will be based on the ultrasound results and whether conservative therapies are successful at eliminating the pain and swelling.   2. Chronic venous insufficiency No surgery or intervention at this point in time.    I have had a long discussion with the patient regarding venous insufficiency and why it  causes symptoms. I have discussed with the patient the chronic skin changes that accompany venous insufficiency and the long term sequela such as infection and ulceration.  Patient will begin wearing graduated compression stockings class 1 (20-30 mmHg) or compression wraps on a daily basis a prescription was given. The patient will put the stockings on first thing in the morning and removing them in the evening. The patient is instructed specifically not to sleep in the stockings.    In addition, behavioral modification including several periods of elevation of the lower extremities during the day will be continued. I have demonstrated that proper elevation is a position with the ankles at heart level.  The patient is instructed to begin routine exercise, especially walking on a  daily basis  3. Lumbosacral disc disease Recommend:  I do not find evidence of Vascular pathology that would explain all of  the patient's symptoms  The patient has atypical pain symptoms for pure vascular disease  Noninvasive studies including venous ultrasound of the legs show reflux in the GSV bilaterally  The patient should continue walking and begin a more formal exercise program. The patient should continue his antiplatelet therapy and aggressive treatment of the lipid abnormalities. The patient should begin wearing graduated compression socks 15-20 mmHg strength to control her mild edema.  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

## 2017-09-02 DIAGNOSIS — N950 Postmenopausal bleeding: Secondary | ICD-10-CM | POA: Diagnosis not present

## 2017-09-30 DIAGNOSIS — N950 Postmenopausal bleeding: Secondary | ICD-10-CM | POA: Diagnosis not present

## 2017-10-15 DIAGNOSIS — M5136 Other intervertebral disc degeneration, lumbar region: Secondary | ICD-10-CM | POA: Diagnosis not present

## 2017-10-15 DIAGNOSIS — Z981 Arthrodesis status: Secondary | ICD-10-CM | POA: Diagnosis not present

## 2017-11-26 ENCOUNTER — Ambulatory Visit (INDEPENDENT_AMBULATORY_CARE_PROVIDER_SITE_OTHER): Payer: BLUE CROSS/BLUE SHIELD | Admitting: Vascular Surgery

## 2017-11-26 ENCOUNTER — Encounter (INDEPENDENT_AMBULATORY_CARE_PROVIDER_SITE_OTHER): Payer: Self-pay | Admitting: Vascular Surgery

## 2017-11-26 VITALS — BP 130/69 | HR 74 | Resp 13 | Ht 66.0 in | Wt 171.0 lb

## 2017-11-26 DIAGNOSIS — I83813 Varicose veins of bilateral lower extremities with pain: Secondary | ICD-10-CM | POA: Diagnosis not present

## 2017-11-26 DIAGNOSIS — I872 Venous insufficiency (chronic) (peripheral): Secondary | ICD-10-CM

## 2017-11-26 DIAGNOSIS — E782 Mixed hyperlipidemia: Secondary | ICD-10-CM

## 2017-11-27 ENCOUNTER — Encounter (INDEPENDENT_AMBULATORY_CARE_PROVIDER_SITE_OTHER): Payer: Self-pay | Admitting: Vascular Surgery

## 2017-11-27 NOTE — Progress Notes (Signed)
MRN : [id number]  Ariana Martin is a 47 y.o. (12/16/1970) female who presents with chief complaint of  Chief Complaint  Patient presents with  . Follow-up    3 month no studies  .  History of Present Illness: The patient returns for followup evaluation 3 months after the initial visit. The patient continues to have pain in the lower extremities with dependency. The pain is lessened with elevation. Graduated compression stockings, Class I (20-30 mmHg), have been worn but the stockings do not eliminate the leg pain. Over-the-counter analgesics do not improve the symptoms. The degree of discomfort continues to interfere with daily activities. The patient notes the pain in the legs is causing problems with daily exercise, at the workplace and even with household activities and maintenance such as standing in the kitchen preparing meals and doing dishes.   Venous ultrasound shows normal deep venous system, no evidence of acute or chronic DVT.  Superficial reflux is present in the bilateral GSV and the right SSV  Current Meds  Medication Sig  . Cetirizine HCl (ZYRTEC ALLERGY) 10 MG CAPS Take by mouth.  . naproxen sodium (ALEVE) 220 MG tablet Take by mouth.  . phentermine (ADIPEX-P) 37.5 MG tablet TAKE 1 TO 1 AND 1/2 TABLETS EVERY MORNING    Past Medical History:  Diagnosis Date  . Allergy     Past Surgical History:  Procedure Laterality Date  . VAGINAL HYSTERECTOMY      Social History Social History   Tobacco Use  . Smoking status: Current Some Day Smoker  . Smokeless tobacco: Never Used  Substance Use Topics  . Alcohol use: Yes    Alcohol/week: 0.0 oz  . Drug use: No    Family History Family History  Problem Relation Age of Onset  . Hypertension Mother   . Hyperlipidemia Mother   . Vascular Disease Father   . Heart disease Father     Allergies  Allergen Reactions  . Sulfa Antibiotics Itching and Rash     REVIEW OF SYSTEMS (Negative unless  checked)  Constitutional: [] Weight loss  [] Fever  [] Chills Cardiac: [] Chest pain   [] Chest pressure   [] Palpitations   [] Shortness of breath when laying flat   [] Shortness of breath with exertion. Vascular:  [] Pain in legs with walking   [x] Pain in legs at rest  [] History of DVT   [] Phlebitis   [x] Swelling in legs   [x] Varicose veins   [] Non-healing ulcers Pulmonary:   [] Uses home oxygen   [] Productive cough   [] Hemoptysis   [] Wheeze  [] COPD   [] Asthma Neurologic:  [] Dizziness   [] Seizures   [] History of stroke   [] History of TIA  [] Aphasia   [] Vissual changes   [] Weakness or numbness in arm   [] Weakness or numbness in leg Musculoskeletal:   [] Joint swelling   [] Joint pain   [] Low back pain Hematologic:  [] Easy bruising  [] Easy bleeding   [] Hypercoagulable state   [] Anemic Gastrointestinal:  [] Diarrhea   [] Vomiting  [] Gastroesophageal reflux/heartburn   [] Difficulty swallowing. Genitourinary:  [] Chronic kidney disease   [] Difficult urination  [] Frequent urination   [] Blood in urine Skin:  [] Rashes   [] Ulcers  Psychological:  [] History of anxiety   []  History of major depression.  Physical Examination  Vitals:   11/26/17 1622  BP: 130/69  Pulse: 74  Resp: 13  Weight: 171 lb (77.6 kg)  Height: 5\' 6"  (1.676 m)   Body mass index is 27.6 kg/m. Gen: WD/WN, NAD Head: [place]/AT, No temporalis  wasting.  Ear/Nose/Throat: Hearing grossly intact, nares w/o erythema or drainage Eyes: PER, EOMI, sclera nonicteric.  Neck: Supple, no large masses.   Pulmonary:  Good air movement, no audible wheezing bilaterally, no use of accessory muscles.  Cardiac: RRR, no JVD Vascular: Large varicosities present extensively greater than 10 mm bilaterally.  Mild venous stasis changes to the legs bilaterally.  2+ soft pitting edema Vessel Right Left  Radial Palpable Palpable  PT Palpable Palpable  DP Palpable Palpable  Gastrointestinal: Non-distended. No guarding/no peritoneal signs.  Musculoskeletal: M/S 5/5  throughout.  No deformity or atrophy.  Neurologic: CN 2-12 intact. Symmetrical.  Speech is fluent. Motor exam as listed above. Psychiatric: Judgment intact, Mood & affect appropriate for pt's clinical situation. Dermatologic: mild venous rashes no ulcers noted.  No changes consistent with cellulitis. Lymph : No lichenification or skin changes of chronic lymphedema.  CBC No results found for: WBC, HGB, HCT, MCV, PLT  BMET No results found for: NA, K, CL, CO2, GLUCOSE, BUN, CREATININE, CALCIUM, GFRNONAA, GFRAA CrCl cannot be calculated (No order found.).  COAG No results found for: INR, PROTIME  Radiology No results found.   Assessment/Plan 1. Varicose veins of both lower extremities with pain Recommend  I have reviewed my previous  discussion with the patient regarding  varicose veins and why they cause symptoms. Patient will continue  wearing graduated compression stockings class 1 on a daily basis, beginning first thing in the morning and removing them in the evening.    In addition, behavioral modification including elevation during the day was again discussed and this will continue.  The patient has utilized over the counter pain medications and has been exercising.  However, at this time conservative therapy has not alleviated the patient's symptoms of leg pain and swelling  Recommend: laser ablation of the right and  left great saphenous veins and the right small saphenous vein to eliminate the symptoms of pain and swelling of the lower extremities caused by the severe superficial venous reflux disease.   2. Chronic venous insufficiency No surgery or intervention at this point in time.    I have had a long discussion with the patient regarding venous insufficiency and why it  causes symptoms. I have discussed with the patient the chronic skin changes that accompany venous insufficiency and the long term sequela such as infection and ulceration.  Patient will begin wearing  graduated compression stockings class 1 (20-30 mmHg) or compression wraps on a daily basis a prescription was given. The patient will put the stockings on first thing in the morning and removing them in the evening. The patient is instructed specifically not to sleep in the stockings.    In addition, behavioral modification including several periods of elevation of the lower extremities during the day will be continued. I have demonstrated that proper elevation is a position with the ankles at heart level.  The patient is instructed to begin routine exercise, especially walking on a daily basis   3. Combined fat and carbohydrate induced hyperlipemia Continue statin as ordered and reviewed, no changes at this time     Levora DredgeGregory Zyan Mirkin, MD  11/27/2017 4:41 PM

## 2017-12-08 DIAGNOSIS — R635 Abnormal weight gain: Secondary | ICD-10-CM | POA: Diagnosis not present

## 2018-01-28 ENCOUNTER — Encounter (INDEPENDENT_AMBULATORY_CARE_PROVIDER_SITE_OTHER): Payer: Self-pay | Admitting: Vascular Surgery

## 2018-01-28 ENCOUNTER — Ambulatory Visit (INDEPENDENT_AMBULATORY_CARE_PROVIDER_SITE_OTHER): Payer: BLUE CROSS/BLUE SHIELD | Admitting: Vascular Surgery

## 2018-01-28 VITALS — BP 128/73 | HR 85 | Resp 12 | Ht 66.0 in | Wt 165.0 lb

## 2018-01-28 DIAGNOSIS — I83811 Varicose veins of right lower extremities with pain: Secondary | ICD-10-CM

## 2018-01-28 DIAGNOSIS — I83813 Varicose veins of bilateral lower extremities with pain: Secondary | ICD-10-CM

## 2018-02-01 ENCOUNTER — Other Ambulatory Visit (INDEPENDENT_AMBULATORY_CARE_PROVIDER_SITE_OTHER): Payer: Self-pay | Admitting: Vascular Surgery

## 2018-02-01 DIAGNOSIS — I872 Venous insufficiency (chronic) (peripheral): Secondary | ICD-10-CM

## 2018-02-01 DIAGNOSIS — I83813 Varicose veins of bilateral lower extremities with pain: Secondary | ICD-10-CM

## 2018-02-02 ENCOUNTER — Ambulatory Visit (INDEPENDENT_AMBULATORY_CARE_PROVIDER_SITE_OTHER): Payer: BLUE CROSS/BLUE SHIELD

## 2018-02-02 DIAGNOSIS — I83813 Varicose veins of bilateral lower extremities with pain: Secondary | ICD-10-CM | POA: Diagnosis not present

## 2018-02-02 DIAGNOSIS — I872 Venous insufficiency (chronic) (peripheral): Secondary | ICD-10-CM

## 2018-02-06 ENCOUNTER — Encounter (INDEPENDENT_AMBULATORY_CARE_PROVIDER_SITE_OTHER): Payer: Self-pay | Admitting: Vascular Surgery

## 2018-02-06 NOTE — Progress Notes (Signed)
    MRN : [id number]  Ariana Norlandermy L Martin is a 47 y.o. (12-28-1970) female who presents with chief complaint of  Chief Complaint  Patient presents with  . Follow-up    Right-Double Laser ablation  .    The patient's right lower extremity was sterilely prepped and draped.  The ultrasound machine was used to visualize the great saphenous vein throughout its course.  A segment of the great saphenous vein was selected for access.  The saphenous vein was accessed without difficulty using ultrasound guidance with a micropuncture needle.   An 0.018  wire was placed beyond the saphenofemoral junction through the sheath and the microneedle was removed.  The 65 cm sheath was then placed over the wire and the wire and dilator were removed.  The laser fiber was placed through the sheath and its tip was placed approximately 2 cm below the saphenofemoral junction.  Tumescent anesthesia was then created with a dilute lidocaine solution.  Laser energy was then delivered with constant withdrawal of the sheath and laser fiber.  Approximately 1395 Joules of energy were delivered over a length of 32 cm.    The patient's leg was then repositioned and reprepped and redraped in a sterile fashion. The small saphenous vein was then evaluated with ultrasound.   A segment of the right small saphenousvein was selected for access.  The saphenous vein was accessed without difficulty using ultrasound guidance with a micropuncture needle.   An 0.018  wire was placed beyond the saphenopopliteal junction through the sheath and the microneedle was removed.  The 65 cm sheath was then placed over the wire and the wire and dilator were removed.  The laser fiber was placed through the sheath and its tip was placed approximately 2 cm below the saphenopopliteal junction.  Tumescent anesthesia was then created with a dilute lidocaine solution.  Laser energy was then delivered with constant withdrawal of the sheath and laser fiber.  Approximately 471  Joules of energy were delivered over a length of 11 cm.    Sterile dressings were placed.  The patient tolerated the procedure well without complications.

## 2018-02-10 NOTE — Progress Notes (Signed)
    MRN : [id number]  Ariana Martin is a 47 y.o. ([birth year]) female who presents with chief complaint of No chief complaint on file. .    The patient's left lower extremity was sterilely prepped and draped.  The ultrasound machine was used to visualize the left great saphenous vein throughout its course.  A segment of the great saphenous vein at the knee level was selected for access.  The saphenous vein was accessed without difficulty using ultrasound guidance with a micropuncture needle.   An 0.018  wire was placed beyond the saphenofemoral junction through the sheath and the microneedle was removed.  The 65 cm sheath was then placed over the wire and the wire and dilator were removed.  The laser fiber was placed through the sheath and its tip was placed approximately 2 cm below the saphenofemoral junction.  Tumescent anesthesia was then created with a dilute lidocaine solution.  Laser energy was then delivered with constant withdrawal of the sheath and laser fiber.  Approximately 1344 joules of energy were delivered over a length of 31 cm.  Sterile dressings were placed.  The patient tolerated the procedure well without complications.

## 2018-02-11 ENCOUNTER — Ambulatory Visit (INDEPENDENT_AMBULATORY_CARE_PROVIDER_SITE_OTHER): Payer: BLUE CROSS/BLUE SHIELD | Admitting: Vascular Surgery

## 2018-02-11 ENCOUNTER — Encounter (INDEPENDENT_AMBULATORY_CARE_PROVIDER_SITE_OTHER): Payer: Self-pay | Admitting: Vascular Surgery

## 2018-02-11 VITALS — BP 108/54 | HR 82 | Resp 13 | Ht 65.0 in | Wt 165.0 lb

## 2018-02-11 DIAGNOSIS — I83811 Varicose veins of right lower extremities with pain: Secondary | ICD-10-CM

## 2018-02-11 DIAGNOSIS — I83813 Varicose veins of bilateral lower extremities with pain: Secondary | ICD-10-CM

## 2018-02-11 DIAGNOSIS — I83812 Varicose veins of left lower extremities with pain: Secondary | ICD-10-CM

## 2018-02-16 ENCOUNTER — Ambulatory Visit (INDEPENDENT_AMBULATORY_CARE_PROVIDER_SITE_OTHER): Payer: BLUE CROSS/BLUE SHIELD

## 2018-02-16 ENCOUNTER — Encounter (INDEPENDENT_AMBULATORY_CARE_PROVIDER_SITE_OTHER): Payer: Self-pay | Admitting: Vascular Surgery

## 2018-02-16 ENCOUNTER — Other Ambulatory Visit (INDEPENDENT_AMBULATORY_CARE_PROVIDER_SITE_OTHER): Payer: Self-pay | Admitting: Vascular Surgery

## 2018-02-16 DIAGNOSIS — I83813 Varicose veins of bilateral lower extremities with pain: Secondary | ICD-10-CM

## 2018-03-08 ENCOUNTER — Ambulatory Visit (INDEPENDENT_AMBULATORY_CARE_PROVIDER_SITE_OTHER): Payer: BLUE CROSS/BLUE SHIELD | Admitting: Vascular Surgery

## 2018-03-08 ENCOUNTER — Encounter (INDEPENDENT_AMBULATORY_CARE_PROVIDER_SITE_OTHER): Payer: Self-pay | Admitting: Vascular Surgery

## 2018-03-08 VITALS — BP 118/66 | HR 82 | Resp 16 | Ht 66.0 in | Wt 165.4 lb

## 2018-03-08 DIAGNOSIS — Z9889 Other specified postprocedural states: Secondary | ICD-10-CM

## 2018-03-08 DIAGNOSIS — M519 Unspecified thoracic, thoracolumbar and lumbosacral intervertebral disc disorder: Secondary | ICD-10-CM

## 2018-03-08 DIAGNOSIS — E782 Mixed hyperlipidemia: Secondary | ICD-10-CM | POA: Diagnosis not present

## 2018-03-08 DIAGNOSIS — I872 Venous insufficiency (chronic) (peripheral): Secondary | ICD-10-CM

## 2018-03-08 DIAGNOSIS — I83813 Varicose veins of bilateral lower extremities with pain: Secondary | ICD-10-CM

## 2018-03-08 NOTE — Progress Notes (Signed)
MRN : [id number]  Ariana Martin is a 47 y.o. (1970/11/20) female who presents with chief complaint of  Chief Complaint  Patient presents with  . Follow-up    post laser follow up  .  History of Present Illness:   The patient returns to the office for followup status post laser ablation of the left saphenous vein on 02/11/2018 and the right GSV on 01/28/2018.  The patient note significant improvement in the lower extremity pain but not resolution of the symptoms. The patient notes multiple residual varicosities bilaterally which continued to hurt with dependent positions and remained tender to palpation. The patient's swelling is minimally from preoperative status. The patient continues to wear graduated compression stockings on a daily basis but these are not eliminating the pain and discomfort. The patient continues to use over-the-counter anti-inflammatory medications to treat the pain and related symptoms but this has not given the patient relief. The patient notes the pain in the lower extremities is causing problems with daily exercise, problems at work and even with household activities such as preparing meals and doing dishes.  The patient is otherwise done well and there have been no complications related to the laser procedure or interval changes in the patient's overall   Post laser ultrasound shows successful ablation of the bilateral GSV    Current Meds  Medication Sig  . ALPRAZolam (XANAX) 0.5 MG tablet TAKE 1ST TABLET 1 HOUR PRIOR TO PROCEDURE AND 2ND TABLETS UPON ARRIVAL TO CLINIC  . Cetirizine HCl (ZYRTEC ALLERGY) 10 MG CAPS Take by mouth.  . EPINEPHrine 0.3 mg/0.3 mL IJ SOAJ injection Inject 0.3 mg into the muscle.  . naproxen sodium (ALEVE) 220 MG tablet Take by mouth.  Marland Kitchen omeprazole (PRILOSEC) 40 MG capsule Take 40 mg by mouth every morning.  . pantoprazole (PROTONIX) 40 MG tablet Take 1 tablet (40 mg total) by mouth daily.  . phentermine (ADIPEX-P) 37.5 MG tablet TAKE 1  TO 1 AND 1/2 TABLETS EVERY MORNING    Past Medical History:  Diagnosis Date  . Allergy     Past Surgical History:  Procedure Laterality Date  . VAGINAL HYSTERECTOMY      Social History Social History   Tobacco Use  . Smoking status: Current Some Day Smoker  . Smokeless tobacco: Never Used  Substance Use Topics  . Alcohol use: Yes    Alcohol/week: 0.0 standard drinks  . Drug use: No    Family History Family History  Problem Relation Age of Onset  . Hypertension Mother   . Hyperlipidemia Mother   . Vascular Disease Father   . Heart disease Father     Allergies  Allergen Reactions  . Sulfa Antibiotics Itching and Rash     REVIEW OF SYSTEMS (Negative unless checked)  Constitutional: [] Weight loss  [] Fever  [] Chills Cardiac: [] Chest pain   [] Chest pressure   [] Palpitations   [] Shortness of breath when laying flat   [] Shortness of breath with exertion. Vascular:  [] Pain in legs with walking   [x] Pain in legs at rest  [] History of DVT   [] Phlebitis   [] Swelling in legs   [x] Varicose veins   [] Non-healing ulcers Pulmonary:   [] Uses home oxygen   [] Productive cough   [] Hemoptysis   [] Wheeze  [] COPD   [] Asthma Neurologic:  [] Dizziness   [] Seizures   [] History of stroke   [] History of TIA  [] Aphasia   [] Vissual changes   [] Weakness or numbness in arm   [] Weakness or numbness in leg  Musculoskeletal:   [] Joint swelling   [x] Joint pain   [x] Low back pain Hematologic:  [] Easy bruising  [] Easy bleeding   [] Hypercoagulable state   [] Anemic Gastrointestinal:  [] Diarrhea   [] Vomiting  [] Gastroesophageal reflux/heartburn   [] Difficulty swallowing. Genitourinary:  [] Chronic kidney disease   [] Difficult urination  [] Frequent urination   [] Blood in urine Skin:  [] Rashes   [] Ulcers  Psychological:  [] History of anxiety   []  History of major depression.  Physical Examination  Vitals:   03/08/18 0858  BP: 118/66  Pulse: 82  Resp: 16  Weight: 165 lb 6.4 oz (75 kg)  Height: 5\' 6"   (1.676 m)   Body mass index is 26.7 kg/m. Gen: WD/WN, NAD Head: [place]/AT, No temporalis wasting.  Ear/Nose/Throat: Hearing grossly intact, nares w/o erythema or drainage Eyes: PER, EOMI, sclera nonicteric.  Neck: Supple, no large masses.   Pulmonary:  Good air movement, no audible wheezing bilaterally, no use of accessory muscles.  Cardiac: RRR, no JVD Vascular: Residual varicosities present bilaterally 5-7 mm.  Mild venous stasis changes to the legs bilaterally.  trace soft pitting edema Vessel Right Left  Radial Palpable Palpable  Gastrointestinal: Non-distended. No guarding/no peritoneal signs.  Musculoskeletal: M/S 5/5 throughout.  No deformity or atrophy.  Neurologic: CN 2-12 intact. Symmetrical.  Speech is fluent. Motor exam as listed above. Psychiatric: Judgment intact, Mood & affect appropriate for pt's clinical situation. Dermatologic: No rashes or ulcers noted.  No changes consistent with cellulitis. Lymph : No lichenification or skin changes of chronic lymphedema.  CBC No results found for: WBC, HGB, HCT, MCV, PLT  BMET No results found for: NA, K, CL, CO2, GLUCOSE, BUN, CREATININE, CALCIUM, GFRNONAA, GFRAA CrCl cannot be calculated (No successful lab value found.).  COAG No results found for: INR, PROTIME  Radiology No results found.   Assessment/Plan 1. Varicose veins of both lower extremities with pain Recommend:  The patient has had successful ablation of the previously incompetent saphenous venous system but still has persistent symptoms of pain and swelling that are having a negative impact on daily life and daily activities.  Patient should undergo injection sclerotherapy to treat the residual varicosities.  The risks, benefits and alternative therapies were reviewed in detail with the patient.  All questions were answered.  The patient agrees to proceed with sclerotherapy at their convenience.  The patient will continue wearing the graduated compression  stockings and using the over-the-counter pain medications to treat her symptoms.       2. Chronic venous insufficiency No surgery or intervention at this point in time.    I have had a long discussion with the patient regarding venous insufficiency and why it  causes symptoms. I have discussed with the patient the chronic skin changes that accompany venous insufficiency and the long term sequela such as infection and ulceration.  Patient will begin wearing graduated compression stockings class 1 (20-30 mmHg) or compression wraps on a daily basis a prescription was given. The patient will put the stockings on first thing in the morning and removing them in the evening. The patient is instructed specifically not to sleep in the stockings.    In addition, behavioral modification including several periods of elevation of the lower extremities during the day will be continued. I have demonstrated that proper elevation is a position with the ankles at heart level.  The patient is instructed to begin routine exercise, especially walking on a daily basis   3. Lumbosacral disc disease Continue NSAID medications as already ordered,  these medications have been reviewed and there are no changes at this time.  Continued activity and therapy was stressed.   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

## 2018-03-18 DIAGNOSIS — N958 Other specified menopausal and perimenopausal disorders: Secondary | ICD-10-CM | POA: Diagnosis not present

## 2018-03-22 ENCOUNTER — Encounter (INDEPENDENT_AMBULATORY_CARE_PROVIDER_SITE_OTHER): Payer: Self-pay | Admitting: Vascular Surgery

## 2018-03-22 ENCOUNTER — Ambulatory Visit (INDEPENDENT_AMBULATORY_CARE_PROVIDER_SITE_OTHER): Payer: BLUE CROSS/BLUE SHIELD | Admitting: Vascular Surgery

## 2018-03-22 VITALS — BP 118/72 | HR 67 | Resp 16 | Ht 65.5 in | Wt 165.0 lb

## 2018-03-22 DIAGNOSIS — I83813 Varicose veins of bilateral lower extremities with pain: Secondary | ICD-10-CM | POA: Diagnosis not present

## 2018-03-22 NOTE — Progress Notes (Signed)
Varicose veins of bilateral  lower extremity with inflammation (454.1  I83.10) Current Plans   Indication: Patient presents with symptomatic varicose veins of the bilateral  lower extremity.   Procedure: Sclerotherapy using hypertonic saline mixed with 1% Lidocaine was performed on the bilateral lower extremity. Compression wraps were placed. The patient tolerated the procedure well. 

## 2018-04-12 ENCOUNTER — Encounter (INDEPENDENT_AMBULATORY_CARE_PROVIDER_SITE_OTHER): Payer: Self-pay | Admitting: Vascular Surgery

## 2018-04-12 ENCOUNTER — Ambulatory Visit (INDEPENDENT_AMBULATORY_CARE_PROVIDER_SITE_OTHER): Payer: BLUE CROSS/BLUE SHIELD | Admitting: Vascular Surgery

## 2018-04-12 VITALS — BP 121/73 | HR 68 | Resp 16 | Ht 66.0 in | Wt 165.0 lb

## 2018-04-12 DIAGNOSIS — I83813 Varicose veins of bilateral lower extremities with pain: Secondary | ICD-10-CM

## 2018-04-12 NOTE — Progress Notes (Signed)
Varicose veins of bilateral  lower extremity with inflammation (454.1  I83.10) Current Plans   Indication: Patient presents with symptomatic varicose veins of the bilateral  lower extremity.   Procedure: Sclerotherapy using hypertonic saline mixed with 1% Lidocaine was performed on the bilateral lower extremity. Compression wraps were placed. The patient tolerated the procedure well. 

## 2018-04-27 ENCOUNTER — Ambulatory Visit (INDEPENDENT_AMBULATORY_CARE_PROVIDER_SITE_OTHER): Payer: BLUE CROSS/BLUE SHIELD | Admitting: Vascular Surgery

## 2018-04-27 ENCOUNTER — Encounter (INDEPENDENT_AMBULATORY_CARE_PROVIDER_SITE_OTHER): Payer: Self-pay | Admitting: Vascular Surgery

## 2018-04-27 VITALS — BP 113/59 | HR 70 | Resp 17 | Ht 66.0 in | Wt 166.0 lb

## 2018-04-27 DIAGNOSIS — I83813 Varicose veins of bilateral lower extremities with pain: Secondary | ICD-10-CM

## 2018-04-27 NOTE — Progress Notes (Signed)
Varicose veins of bilateral  lower extremity with inflammation (454.1  I83.10) Current Plans   Indication: Patient presents with symptomatic varicose veins of the bilateral  lower extremity.   Procedure: Sclerotherapy using hypertonic saline mixed with 1% Lidocaine was performed on the bilateral lower extremity. Compression wraps were placed. The patient tolerated the procedure well. 

## 2018-05-10 ENCOUNTER — Ambulatory Visit (INDEPENDENT_AMBULATORY_CARE_PROVIDER_SITE_OTHER): Payer: BLUE CROSS/BLUE SHIELD | Admitting: Vascular Surgery

## 2018-05-11 ENCOUNTER — Encounter (INDEPENDENT_AMBULATORY_CARE_PROVIDER_SITE_OTHER): Payer: Self-pay | Admitting: Vascular Surgery

## 2018-05-11 ENCOUNTER — Ambulatory Visit (INDEPENDENT_AMBULATORY_CARE_PROVIDER_SITE_OTHER): Payer: BLUE CROSS/BLUE SHIELD | Admitting: Vascular Surgery

## 2018-05-11 VITALS — BP 117/62 | HR 71 | Resp 17 | Ht 67.0 in | Wt 164.0 lb

## 2018-05-11 DIAGNOSIS — I83813 Varicose veins of bilateral lower extremities with pain: Secondary | ICD-10-CM

## 2018-05-11 NOTE — Progress Notes (Signed)
Varicose veins of bilateral  lower extremity with inflammation (454.1  I83.10) Current Plans   Indication: Patient presents with symptomatic varicose veins of the bilateral  lower extremity.   Procedure: Sclerotherapy using hypertonic saline mixed with 1% Lidocaine was performed on the bilateral lower extremity. Compression wraps were placed. The patient tolerated the procedure well. 

## 2018-05-24 ENCOUNTER — Ambulatory Visit (INDEPENDENT_AMBULATORY_CARE_PROVIDER_SITE_OTHER): Payer: BLUE CROSS/BLUE SHIELD | Admitting: Vascular Surgery

## 2018-05-25 ENCOUNTER — Encounter (INDEPENDENT_AMBULATORY_CARE_PROVIDER_SITE_OTHER): Payer: Self-pay | Admitting: Vascular Surgery

## 2018-05-25 ENCOUNTER — Ambulatory Visit (INDEPENDENT_AMBULATORY_CARE_PROVIDER_SITE_OTHER): Payer: BLUE CROSS/BLUE SHIELD | Admitting: Vascular Surgery

## 2018-05-25 VITALS — BP 110/54 | HR 76 | Ht 66.0 in | Wt 165.4 lb

## 2018-05-25 DIAGNOSIS — I83813 Varicose veins of bilateral lower extremities with pain: Secondary | ICD-10-CM | POA: Diagnosis not present

## 2018-05-25 NOTE — Progress Notes (Signed)
Varicose veins of bilateral  lower extremity with inflammation (454.1  I83.10) Current Plans   Indication: Patient presents with symptomatic varicose veins of the bilateral  lower extremity.   Procedure: Sclerotherapy using hypertonic saline mixed with 1% Lidocaine was performed on the bilateral lower extremity. Compression wraps were placed. The patient tolerated the procedure well. 

## 2018-06-07 ENCOUNTER — Ambulatory Visit (INDEPENDENT_AMBULATORY_CARE_PROVIDER_SITE_OTHER): Payer: BLUE CROSS/BLUE SHIELD | Admitting: Vascular Surgery

## 2018-06-22 DIAGNOSIS — N958 Other specified menopausal and perimenopausal disorders: Secondary | ICD-10-CM | POA: Diagnosis not present

## 2018-06-23 ENCOUNTER — Encounter (INDEPENDENT_AMBULATORY_CARE_PROVIDER_SITE_OTHER): Payer: Self-pay | Admitting: Vascular Surgery

## 2018-06-23 ENCOUNTER — Ambulatory Visit (INDEPENDENT_AMBULATORY_CARE_PROVIDER_SITE_OTHER): Payer: BLUE CROSS/BLUE SHIELD | Admitting: Vascular Surgery

## 2018-06-23 VITALS — BP 123/69 | HR 81 | Resp 16 | Ht 66.0 in | Wt 162.4 lb

## 2018-06-23 DIAGNOSIS — I83813 Varicose veins of bilateral lower extremities with pain: Secondary | ICD-10-CM

## 2018-06-23 NOTE — Progress Notes (Signed)
Varicose veins of bilateral lower extremity with inflammation (454.1  I83.10) Current Plans   Indication: Patient presents with symptomatic varicose veins of the bilateral lower extremity.   Procedure: Sclerotherapy using hypertonic saline mixed with 1% Lidocaine was performed on the bilateral lower extremity. Compression wraps were placed. The patient tolerated the procedure well.  Of note: The patient still has multiple areas of painful varicosities located to the bilateral legs. She would benefit from additional saline sclerotherapy.

## 2018-08-19 DIAGNOSIS — R635 Abnormal weight gain: Secondary | ICD-10-CM | POA: Diagnosis not present

## 2018-08-19 DIAGNOSIS — Z01419 Encounter for gynecological examination (general) (routine) without abnormal findings: Secondary | ICD-10-CM | POA: Diagnosis not present

## 2018-08-24 DIAGNOSIS — Z1329 Encounter for screening for other suspected endocrine disorder: Secondary | ICD-10-CM | POA: Diagnosis not present

## 2018-08-24 DIAGNOSIS — Z1321 Encounter for screening for nutritional disorder: Secondary | ICD-10-CM | POA: Diagnosis not present

## 2018-08-24 DIAGNOSIS — Z136 Encounter for screening for cardiovascular disorders: Secondary | ICD-10-CM | POA: Diagnosis not present

## 2018-08-24 DIAGNOSIS — Z131 Encounter for screening for diabetes mellitus: Secondary | ICD-10-CM | POA: Diagnosis not present

## 2018-12-03 ENCOUNTER — Telehealth (INDEPENDENT_AMBULATORY_CARE_PROVIDER_SITE_OTHER): Payer: Self-pay | Admitting: Nurse Practitioner

## 2018-12-03 DIAGNOSIS — Z1231 Encounter for screening mammogram for malignant neoplasm of breast: Secondary | ICD-10-CM | POA: Diagnosis not present

## 2018-12-03 NOTE — Telephone Encounter (Signed)
Patient called requesting apt to be seen due to pain in bend of Left the knee. Pain x 1 week. Some swelling, no redness, no discoloration. Please advise. AS, CMA

## 2018-12-03 NOTE — Telephone Encounter (Signed)
This may not be related to varicose veins, but we can see the patient.  Please remind her to continue to wear her compression stockings.  Let's do a LLE venous reflux.  Please note to the patient that in the event we do not find anything vascular related we will refer her back to her PCP, so she may also want to touch base with them as well.

## 2018-12-03 NOTE — Telephone Encounter (Signed)
Patient is aware of the below and verbalized understanding.  Please schedule patient an apt with LLE venous reflux per Arna Medici. AS, CMA

## 2018-12-07 ENCOUNTER — Other Ambulatory Visit (INDEPENDENT_AMBULATORY_CARE_PROVIDER_SITE_OTHER): Payer: Self-pay | Admitting: Nurse Practitioner

## 2018-12-07 ENCOUNTER — Ambulatory Visit (INDEPENDENT_AMBULATORY_CARE_PROVIDER_SITE_OTHER): Payer: BC Managed Care – PPO | Admitting: Nurse Practitioner

## 2018-12-07 ENCOUNTER — Encounter (INDEPENDENT_AMBULATORY_CARE_PROVIDER_SITE_OTHER): Payer: Self-pay | Admitting: Nurse Practitioner

## 2018-12-07 ENCOUNTER — Other Ambulatory Visit: Payer: Self-pay

## 2018-12-07 ENCOUNTER — Ambulatory Visit (INDEPENDENT_AMBULATORY_CARE_PROVIDER_SITE_OTHER): Payer: BC Managed Care – PPO

## 2018-12-07 VITALS — BP 139/77 | HR 68 | Resp 16 | Wt 159.6 lb

## 2018-12-07 DIAGNOSIS — M25562 Pain in left knee: Secondary | ICD-10-CM | POA: Diagnosis not present

## 2018-12-07 DIAGNOSIS — M7122 Synovial cyst of popliteal space [Baker], left knee: Secondary | ICD-10-CM | POA: Diagnosis not present

## 2018-12-07 DIAGNOSIS — R200 Anesthesia of skin: Secondary | ICD-10-CM

## 2018-12-07 DIAGNOSIS — R202 Paresthesia of skin: Secondary | ICD-10-CM

## 2018-12-07 DIAGNOSIS — I83813 Varicose veins of bilateral lower extremities with pain: Secondary | ICD-10-CM | POA: Diagnosis not present

## 2018-12-14 ENCOUNTER — Encounter (INDEPENDENT_AMBULATORY_CARE_PROVIDER_SITE_OTHER): Payer: Self-pay | Admitting: Nurse Practitioner

## 2018-12-14 DIAGNOSIS — R200 Anesthesia of skin: Secondary | ICD-10-CM | POA: Insufficient documentation

## 2018-12-14 DIAGNOSIS — R202 Paresthesia of skin: Secondary | ICD-10-CM | POA: Insufficient documentation

## 2018-12-14 DIAGNOSIS — M7122 Synovial cyst of popliteal space [Baker], left knee: Secondary | ICD-10-CM | POA: Insufficient documentation

## 2018-12-14 NOTE — Progress Notes (Signed)
SUBJECTIVE:  Patient ID: Ariana Martin, female    DOB: 1970/11/26, 48 y.o.   MRN: [id number] Chief Complaint  Patient presents with  . Follow-up    ultrasound follow up    HPI  Ariana Martin is a 48 y.o. female that presents today with complaints of swelling behind her left knee that has been persistent for a week it is rather painful.  The patient has a previous history of bilateral endovenous laser ablations.  The patient was concerned that something may be going on with her veins at this time.   the patient also has an additional complaint of numbness and tingling of her feet.  She states that this is also been going on for some time however it comes and goes.  She denies any fever, chills, nausea, vomiting or diarrhea.  She denies any chest pain or shortness of breath.  Since her ablation several years ago the patient has been wearing medical grade 1 compression stockings on a daily basis, she elevates her lower extremities as much as possible as well as exercise daily.  She has been utilizing NSAIDs for pain relief however they have not provided much relief.  The pain in her legs has become lifestyle limiting affecting her ability to do things such as washing dishes.  The patient underwent a left lower extremity venous reflux study which revealed two Baker's cyst behind her left knee.  There is no evidence of DVT.  There is evidence of reflux within the left saphenofemoral junction as well as great saphenous vein.  Past Medical History:  Diagnosis Date  . Allergy     Past Surgical History:  Procedure Laterality Date  . VAGINAL HYSTERECTOMY      Social History   Socioeconomic History  . Marital status: Married    Spouse name: Not on file  . Number of children: Not on file  . Years of education: Not on file  . Highest education level: Not on file  Occupational History  . Not on file  Social Needs  . Financial resource strain: Not on file  . Food insecurity    Worry: Not on  file    Inability: Not on file  . Transportation needs    Medical: Not on file    Non-medical: Not on file  Tobacco Use  . Smoking status: Current Some Day Smoker  . Smokeless tobacco: Never Used  Substance and Sexual Activity  . Alcohol use: Yes    Alcohol/week: 0.0 standard drinks  . Drug use: No  . Sexual activity: Not on file  Lifestyle  . Physical activity    Days per week: Not on file    Minutes per session: Not on file  . Stress: Not on file  Relationships  . Social Musicianconnections    Talks on phone: Not on file    Gets together: Not on file    Attends religious service: Not on file    Active member of club or organization: Not on file    Attends meetings of clubs or organizations: Not on file    Relationship status: Not on file  . Intimate partner violence    Fear of current or ex partner: Not on file    Emotionally abused: Not on file    Physically abused: Not on file    Forced sexual activity: Not on file  Other Topics Concern  . Not on file  Social History Narrative  . Not on file  Family History  Problem Relation Age of Onset  . Hypertension Mother   . Hyperlipidemia Mother   . Vascular Disease Father   . Heart disease Father     Allergies  Allergen Reactions  . Sulfa Antibiotics Itching and Rash     Review of Systems   Review of Systems: Negative Unless Checked Constitutional: [] Weight loss  [] Fever  [] Chills Cardiac: [] Chest pain   []  Atrial Fibrillation  [] Palpitations   [] Shortness of breath when laying flat   [] Shortness of breath with exertion. [] Shortness of breath at rest Vascular:  [] Pain in legs with walking   [] Pain in legs with standing [] Pain in legs when laying flat   [] Claudication    [] Pain in feet when laying flat    [] History of DVT   [] Phlebitis   [x] Swelling in legs   [x] Varicose veins   [] Non-healing ulcers Pulmonary:   [] Uses home oxygen   [] Productive cough   [] Hemoptysis   [] Wheeze  [] COPD   [] Asthma Neurologic:  [] Dizziness    [] Seizures  [] Blackouts [] History of stroke   [] History of TIA  [] Aphasia   [] Temporary Blindness   [] Weakness or numbness in arm   [x] Weakness or numbness in leg Musculoskeletal:   [] Joint swelling   [] Joint pain   [x] Low back pain  []  History of Knee Replacement [] Arthritis [] back Surgeries  []  Spinal Stenosis    Hematologic:  [] Easy bruising  [] Easy bleeding   [] Hypercoagulable state   [] Anemic Gastrointestinal:  [] Diarrhea   [] Vomiting  [] Gastroesophageal reflux/heartburn   [] Difficulty swallowing. [] Abdominal pain Genitourinary:  [] Chronic kidney disease   [] Difficult urination  [] Anuric   [] Blood in urine [] Frequent urination  [] Burning with urination   [] Hematuria Skin:  [] Rashes   [] Ulcers [] Wounds Psychological:  [] History of anxiety   []  History of major depression  []  Memory Difficulties      OBJECTIVE:   Physical Exam  BP 139/77 (BP Location: Right Arm)   Pulse 68   Resp 16   Wt 159 lb 9.6 oz (72.4 kg)   BMI 25.76 kg/m   Gen: WD/WN, NAD Head: [place]/AT, No temporalis wasting.  Ear/Nose/Throat: Hearing grossly intact, nares w/o erythema or drainage Eyes: PER, EOMI, sclera nonicteric.  Neck: Supple, no masses.  No JVD.  Pulmonary:  Good air movement, no use of accessory muscles.  Cardiac: RRR Vascular: scattered varicosities present bilaterally.  Mild venous stasis changes to the legs bilaterally.  2+ soft pitting edema  Vessel Right Left  Radial Palpable Palpable  Dorsalis Pedis Palpable Palpable  Posterior Tibial Palpable Palpable   Gastrointestinal: soft, non-distended. No guarding/no peritoneal signs.  Musculoskeletal: M/S 5/5 throughout.  No deformity or atrophy.  Baker's cyst behind left knee. Neurologic: Pain and light touch intact in extremities.  Symmetrical.  Speech is fluent. Motor exam as listed above. Psychiatric: Judgment intact, Mood & affect appropriate for pt's clinical situation. Dermatologic: No Venous rashes. No Ulcers Noted.  No changes consistent  with cellulitis. Lymph : No Cervical lymphadenopathy, no lichenification or skin changes of chronic lymphedema.       ASSESSMENT AND PLAN:  1. Varicose veins of both lower extremities with pain Recommend  I have reviewed my previous  discussion with the patient regarding  varicose veins and why they cause symptoms. Patient will continue  wearing graduated compression stockings class 1 on a daily basis, beginning first thing in the morning and removing them in the evening.    In addition, behavioral modification including elevation during the day was again  discussed and this will continue.  The patient has utilized over the counter pain medications and has been exercising.  However, at this time conservative therapy has not alleviated the patient's symptoms of leg pain and swelling  Recommend: laser ablation of the left great saphenous veins to eliminate the symptoms of pain and swelling of the lower extremities caused by the severe superficial venous reflux disease.   2. Numbness and tingling of leg Patient complains that she had numbness in her feet.  Based on the patient's risk factors it is reasonable to screen her for any possible peripheral artery disease.  It is also possible this is a sequela from her sciatica and low back pain.  If it is found that there is no evidence of peripheral artery disease, the patient will follow-up with your PCP for further work-up.  3. Baker's cyst of knee, left Baker's cyst are generally benign and without issue.  I have advised the patient to follow-up with her PCP to determine if specialist intervention is needed or if conservative management will be available.  Current Outpatient Medications on File Prior to Visit  Medication Sig Dispense Refill  . ALPRAZolam (XANAX) 0.5 MG tablet TAKE 1ST TABLET 1 HOUR PRIOR TO PROCEDURE AND 2ND TABLETS UPON ARRIVAL TO CLINIC  0  . Cetirizine HCl (ZYRTEC ALLERGY) 10 MG CAPS Take by mouth.    . EPINEPHrine 0.3  mg/0.3 mL IJ SOAJ injection Inject 0.3 mg into the muscle.    . naproxen sodium (ALEVE) 220 MG tablet Take by mouth.    Marland Kitchen. omeprazole (PRILOSEC) 40 MG capsule Take 40 mg by mouth every morning.  0  . pantoprazole (PROTONIX) 40 MG tablet Take 1 tablet (40 mg total) by mouth daily. 30 tablet 0  . phentermine (ADIPEX-P) 37.5 MG tablet TAKE 1 TO 1 AND 1/2 TABLETS EVERY MORNING  0   No current facility-administered medications on file prior to visit.     There are no Patient Instructions on file for this visit. No follow-ups on file.   Georgiana SpinnerFallon E Camylle Whicker, NP  This note was completed with Office managerDragon Dictation.  Any errors are purely unintentional.

## 2018-12-28 DIAGNOSIS — H6692 Otitis media, unspecified, left ear: Secondary | ICD-10-CM | POA: Diagnosis not present

## 2018-12-28 DIAGNOSIS — R0981 Nasal congestion: Secondary | ICD-10-CM | POA: Diagnosis not present

## 2018-12-28 DIAGNOSIS — Z20828 Contact with and (suspected) exposure to other viral communicable diseases: Secondary | ICD-10-CM | POA: Diagnosis not present

## 2018-12-29 ENCOUNTER — Encounter (INDEPENDENT_AMBULATORY_CARE_PROVIDER_SITE_OTHER): Payer: BC Managed Care – PPO

## 2018-12-29 ENCOUNTER — Ambulatory Visit (INDEPENDENT_AMBULATORY_CARE_PROVIDER_SITE_OTHER): Payer: BC Managed Care – PPO | Admitting: Nurse Practitioner

## 2019-01-11 DIAGNOSIS — R635 Abnormal weight gain: Secondary | ICD-10-CM | POA: Diagnosis not present

## 2019-01-17 DIAGNOSIS — R928 Other abnormal and inconclusive findings on diagnostic imaging of breast: Secondary | ICD-10-CM | POA: Diagnosis not present

## 2019-01-17 DIAGNOSIS — R922 Inconclusive mammogram: Secondary | ICD-10-CM | POA: Diagnosis not present

## 2019-01-17 DIAGNOSIS — N6001 Solitary cyst of right breast: Secondary | ICD-10-CM | POA: Diagnosis not present

## 2019-01-17 DIAGNOSIS — N6489 Other specified disorders of breast: Secondary | ICD-10-CM | POA: Diagnosis not present

## 2019-04-13 NOTE — Progress Notes (Signed)
    MRN : [id number]  Ariana Martin is a 48 y.o. ([birth year]) female who presents with chief complaint of painful lower extremity with painful varicsoe veins.    The patient's left lower extremity was sterilely prepped and draped.  The ultrasound machine was used to visualize the left saphenous vein throughout its course.  Imaging today demonstrated a very small saphenous vein with areas that were sclerotic.  Several segments were selected for access.  Multiple attempts were made at accessing the saphenous vein but these were unsuccessful.  Several times the wire was advanced it could be seen with ultrasound to be intraluminal but would only course a distance of 5 or 6 cm and this was not sufficient to move forward with laser ablation.  Therefore further attempts were terminated and dressings were placed.  The patient tolerated the procedure well without complications except that the vein was small and sclerotic and could not be accessed.  No laser ablation was performed at this sitting.

## 2019-04-14 ENCOUNTER — Encounter (INDEPENDENT_AMBULATORY_CARE_PROVIDER_SITE_OTHER): Payer: Self-pay | Admitting: Vascular Surgery

## 2019-04-14 ENCOUNTER — Other Ambulatory Visit: Payer: Self-pay

## 2019-04-14 ENCOUNTER — Ambulatory Visit (INDEPENDENT_AMBULATORY_CARE_PROVIDER_SITE_OTHER): Payer: BC Managed Care – PPO | Admitting: Vascular Surgery

## 2019-04-14 VITALS — BP 118/65 | HR 83 | Resp 16

## 2019-04-14 DIAGNOSIS — I83813 Varicose veins of bilateral lower extremities with pain: Secondary | ICD-10-CM

## 2019-04-17 NOTE — Progress Notes (Deleted)
MRN : [id number]  Ariana Martin is a 48 y.o. ([birth year]) female who presents with chief complaint of No chief complaint on file. Marland Kitchen  History of Present Illness:   The patient returns to the office for followup status post unsuccessful laser ablation of the left great saphenous vein on 04/14/2019. The patient notes multiple residual varicosities bilaterally which continued to hurt with dependent positions and remained tender to palpation. The patient's swelling is unchanged from preoperative status. The patient continues to wear graduated compression stockings on a daily basis but these are not eliminating the pain and discomfort. The patient continues to use over-the-counter anti-inflammatory medications to treat the pain and related symptoms but this has not given the patient relief. The patient notes the pain in the lower extremities is causing problems with daily exercise, problems at work and even with household activities such as preparing meals and doing dishes.  The patient is otherwise done well and there have been no complications related to the laser procedure or interval changes in the patient's overall   Venous ultrasound post laser shows successful laser ablation of the ***, no DVT identified.  No outpatient medications have been marked as taking for the 04/18/19 encounter (Appointment) with Gilda Crease, Latina Craver, MD.    Past Medical History:  Diagnosis Date  . Allergy     Past Surgical History:  Procedure Laterality Date  . VAGINAL HYSTERECTOMY      Social History Social History   Tobacco Use  . Smoking status: Current Some Day Smoker  . Smokeless tobacco: Never Used  Substance Use Topics  . Alcohol use: Yes    Alcohol/week: 0.0 standard drinks  . Drug use: No    Family History Family History  Problem Relation Age of Onset  . Hypertension Mother   . Hyperlipidemia Mother   . Vascular Disease Father   . Heart disease Father     Allergies  Allergen Reactions   . Sulfa Antibiotics Itching and Rash     REVIEW OF SYSTEMS (Negative unless checked)  Constitutional: [] Weight loss  [] Fever  [] Chills Cardiac: [] Chest pain   [] Chest pressure   [] Palpitations   [] Shortness of breath when laying flat   [] Shortness of breath with exertion. Vascular:  [] Pain in legs with walking   [] Pain in legs at rest  [] History of DVT   [] Phlebitis   [] Swelling in legs   [] Varicose veins   [] Non-healing ulcers Pulmonary:   [] Uses home oxygen   [] Productive cough   [] Hemoptysis   [] Wheeze  [] COPD   [] Asthma Neurologic:  [] Dizziness   [] Seizures   [] History of stroke   [] History of TIA  [] Aphasia   [] Vissual changes   [] Weakness or numbness in arm   [] Weakness or numbness in leg Musculoskeletal:   [] Joint swelling   [] Joint pain   [] Low back pain Hematologic:  [] Easy bruising  [] Easy bleeding   [] Hypercoagulable state   [] Anemic Gastrointestinal:  [] Diarrhea   [] Vomiting  [] Gastroesophageal reflux/heartburn   [] Difficulty swallowing. Genitourinary:  [] Chronic kidney disease   [] Difficult urination  [] Frequent urination   [] Blood in urine Skin:  [] Rashes   [] Ulcers  Psychological:  [] History of anxiety   []  History of major depression.  Physical Examination  There were no vitals filed for this visit. There is no height or weight on file to calculate BMI. Gen: WD/WN, NAD Head: [place]/AT, No temporalis wasting.  Ear/Nose/Throat: Hearing grossly intact, nares w/o erythema or drainage Eyes: PER, EOMI, sclera nonicteric.  Neck:  Supple, no large masses.   Pulmonary:  Good air movement, no audible wheezing bilaterally, no use of accessory muscles.  Cardiac: RRR, no JVD Vascular: *** Vessel Right Left  Radial Palpable Palpable  Ulnar Palpable Palpable  Brachial Palpable Palpable  Carotid Palpable Palpable  Femoral Palpable Palpable  Popliteal Palpable Palpable  PT Palpable Palpable  DP Palpable Palpable  Gastrointestinal: Non-distended. No guarding/no peritoneal signs.   Musculoskeletal: M/S 5/5 throughout.  No deformity or atrophy.  Neurologic: CN 2-12 intact. Symmetrical.  Speech is fluent. Motor exam as listed above. Psychiatric: Judgment intact, Mood & affect appropriate for pt's clinical situation. Dermatologic: No rashes or ulcers noted.  No changes consistent with cellulitis. Lymph : No lichenification or skin changes of chronic lymphedema.  CBC No results found for: WBC, HGB, HCT, MCV, PLT  BMET No results found for: NA, K, CL, CO2, GLUCOSE, BUN, CREATININE, CALCIUM, GFRNONAA, GFRAA CrCl cannot be calculated (No successful lab value found.).  COAG No results found for: INR, PROTIME  Radiology No results found.  Outside Studies/Documentation *** pages of outside documents were reviewed.  They showed ***.  Assessment/Plan 1. Varicose veins of both lower extremities with pain ***    Hortencia Pilar, MD  04/17/2019 11:19 AM

## 2019-04-18 ENCOUNTER — Encounter (INDEPENDENT_AMBULATORY_CARE_PROVIDER_SITE_OTHER): Payer: Self-pay

## 2019-04-18 ENCOUNTER — Other Ambulatory Visit (INDEPENDENT_AMBULATORY_CARE_PROVIDER_SITE_OTHER): Payer: BC Managed Care – PPO

## 2019-04-18 ENCOUNTER — Other Ambulatory Visit (INDEPENDENT_AMBULATORY_CARE_PROVIDER_SITE_OTHER): Payer: Self-pay | Admitting: Vascular Surgery

## 2019-04-18 ENCOUNTER — Ambulatory Visit (INDEPENDENT_AMBULATORY_CARE_PROVIDER_SITE_OTHER): Payer: BC Managed Care – PPO | Admitting: Vascular Surgery

## 2019-04-18 ENCOUNTER — Other Ambulatory Visit: Payer: Self-pay

## 2019-04-18 DIAGNOSIS — I83812 Varicose veins of left lower extremities with pain: Secondary | ICD-10-CM | POA: Diagnosis not present

## 2019-05-11 ENCOUNTER — Ambulatory Visit (INDEPENDENT_AMBULATORY_CARE_PROVIDER_SITE_OTHER): Payer: BC Managed Care – PPO | Admitting: Nurse Practitioner

## 2019-06-20 ENCOUNTER — Encounter: Payer: Self-pay | Admitting: Emergency Medicine

## 2019-06-20 ENCOUNTER — Ambulatory Visit
Admission: EM | Admit: 2019-06-20 | Discharge: 2019-06-20 | Disposition: A | Discharge status: Home / Self Care | Payer: BC Managed Care – PPO | Attending: Internal Medicine | Admitting: Internal Medicine

## 2019-06-20 ENCOUNTER — Other Ambulatory Visit: Payer: Self-pay

## 2019-06-20 DIAGNOSIS — J0100 Acute maxillary sinusitis, unspecified: Secondary | ICD-10-CM | POA: Insufficient documentation

## 2019-06-20 DIAGNOSIS — U071 COVID-19: Secondary | ICD-10-CM | POA: Insufficient documentation

## 2019-06-20 DIAGNOSIS — Z1152 Encounter for screening for COVID-19: Secondary | ICD-10-CM | POA: Diagnosis not present

## 2019-06-20 DIAGNOSIS — R300 Dysuria: Secondary | ICD-10-CM | POA: Diagnosis not present

## 2019-06-20 LAB — URINALYSIS, COMPLETE (UACMP) WITH MICROSCOPIC
Bilirubin Urine: NEGATIVE
Glucose, UA: NEGATIVE mg/dL
Ketones, ur: NEGATIVE mg/dL
Leukocytes,Ua: NEGATIVE
Nitrite: NEGATIVE
Protein, ur: NEGATIVE mg/dL
Specific Gravity, Urine: >1.03 — ABNORMAL HIGH (ref 1.005–1.030)
pH: 6 (ref 5.0–8.0)

## 2019-06-20 MED ORDER — FLUTICASONE PROPIONATE 50 MCG/ACT NA SUSP: 2.0000 | Freq: Every day | NASAL | 0 refills | Status: AC

## 2019-06-20 MED ORDER — IBUPROFEN 800 MG PO TABS: 800.0000 mg | ORAL_TABLET | Freq: Three times a day (TID) | ORAL | 0 refills | Status: AC | PRN

## 2019-06-20 MED ORDER — AMOXICILLIN-POT CLAVULANATE 875-125 MG PO TABS: 1.0000 | ORAL_TABLET | Freq: Two times a day (BID) | ORAL | 0 refills | Status: AC

## 2019-06-20 NOTE — ED Triage Notes (Signed)
Pt c/o urinary frequency, lower back pain, nasal congestion, headache, cough. Sinus problem started about 2 weeks ago and the urinary symptoms started about 2 days ago. Denies fever.

## 2019-06-20 NOTE — ED Provider Notes (Signed)
[facility]    CSN: [id number] Arrival date & time: 06/20/19  1515      History   Chief Complaint Chief Complaint  Patient presents with  . Urinary Urgency  . Back Pain  . Nasal Congestion    HPI Ariana Martin is a 49 y.o. female.   Subjective:  Ariana Martin is a 49 y.o. female who complains of dysuria, foul smelling urine and lower back pain for a few days. Patient denies vaginal discharge.  Patient does not have a history of recurrent UTI.  Patient does not have a history of pyelonephritis. She no longer has periods secondary to history of complete hysterectomy  Ms. Ariana Martin also presents for evaluation of possible sinus infection. Symptoms include bilateral ear pressure/pain, congestion, facial pain and sinus pressure with no fever, chills, night sweats or weight loss. Onset of symptoms was several weeks ago and has been unchanged since that time.  She is drinking plenty of fluids.  Past history is significant for no history of pneumonia or bronchitis. Patient is a non-smoker.   The following portions of the patient's history were reviewed and updated as appropriate: allergies, current medications, past family history, past medical history, past social history, past surgical history and problem list.        Past Medical History:  Diagnosis Date  . Allergy     Patient Active Problem List   Diagnosis Date Noted  . Numbness and tingling of leg 12/14/2018  . Baker's cyst of knee, left 12/14/2018  . Varicose veins of both lower extremities with pain 08/20/2017  . Chronic venous insufficiency 08/20/2017  . Bilateral low back pain with left-sided sciatica 02/22/2015  . Lumbosacral disc disease 02/22/2015  . Chest pain 11/30/2013  . 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 VAGINAL HYSTERECTOMY      OB History   No obstetric history on file.      Home Medications    Prior to  Admission medications   Medication Sig Start Date End Date Taking? Authorizing Provider  ALPRAZolam (XANAX) 0.5 MG tablet TAKE 1ST TABLET 1 HOUR PRIOR TO PROCEDURE AND 2ND TABLETS UPON ARRIVAL TO CLINIC 01/27/18   [provider]  amoxicillin-clavulanate (AUGMENTIN) 875-125 MG tablet Take 1 tablet by mouth every 12 (twelve) hours. 06/20/19   Lurline Idol, FNP  Cetirizine HCl (ZYRTEC ALLERGY) 10 MG CAPS Take by mouth.    [provider]  EPINEPHrine 0.3 mg/0.3 mL IJ SOAJ injection Inject 0.3 mg into the muscle. 03/02/16   [provider]  fluticasone (FLONASE) 50 MCG/ACT nasal spray Place 2 sprays into both nostrils daily. 06/20/19   Lurline Idol, FNP  ibuprofen (ADVIL) 800 MG tablet Take 1 tablet (800 mg total) by mouth every 8 (eight) hours as needed. 06/20/19   Lurline Idol, FNP  naproxen sodium (ALEVE) 220 MG tablet Take by mouth.    [provider]  omeprazole (PRILOSEC) 40 MG capsule Take 40 mg by mouth every morning. 01/27/18   [provider]  pantoprazole (PROTONIX) 40 MG tablet Take 1 tablet (40 mg total) by mouth daily. 11/07/16   Duanne Limerick, MD  phentermine (ADIPEX-P) 37.5 MG tablet TAKE 1 TO 1 AND 1/2 TABLETS EVERY MORNING 10/13/17   [provider]    Family History Family History  Problem Relation Age of Onset  . Hypertension Mother   . Hyperlipidemia Mother   . Vascular Disease Father   .  Heart disease Father     Social History Social History   Tobacco Use  . Smoking status: Former Games developer  . Smokeless tobacco: Never Used  Substance Use Topics  . Alcohol use: Yes    Alcohol/week: 0.0 standard drinks  . Drug use: No     Allergies   Sulfa antibiotics   Review of Systems Review of Systems  Constitutional: Negative.   HENT: Positive for congestion and sinus pressure.   Gastrointestinal: Negative.   Genitourinary: Positive for dysuria. Negative for vaginal discharge.  Musculoskeletal: Positive for  back pain.  All other systems reviewed and are negative.    Physical Exam Triage Vital Signs ED Triage Vitals  Enc Vitals Group     BP 06/20/19 1540 (!) 117/59     Pulse Rate 06/20/19 1540 63     Resp 06/20/19 1540 18     Temp 06/20/19 1540 97.7 F (36.5 C)     Temp Source 06/20/19 1540 Oral     SpO2 06/20/19 1540 98 %     Weight 06/20/19 1536 158 lb (71.7 kg)     Height 06/20/19 1536 5\' 6"  (1.676 m)     Head Circumference --      Peak Flow --      Pain Score 06/20/19 1536 3     Pain Loc --      Pain Edu? --      Excl. in GC? --    No data found.  Updated Vital Signs BP (!) 117/59 (BP Location: Left Arm)   Pulse 63   Temp 97.7 F (36.5 C) (Oral)   Resp 18   Ht 5\' 6"  (1.676 m)   Wt 158 lb (71.7 kg)   SpO2 98%   BMI 25.50 kg/m   Visual Acuity Right Eye Distance:   Left Eye Distance:   Bilateral Distance:    Right Eye Near:   Left Eye Near:    Bilateral Near:     Physical Exam   UC Treatments / Results  Labs (all labs ordered are listed, but only abnormal results are displayed) Labs Reviewed  URINALYSIS, COMPLETE (UACMP) WITH MICROSCOPIC - Abnormal; Notable for the following components:      Result Value   Specific Gravity, Urine >1.030 (*)    Hgb urine dipstick MODERATE (*)    Bacteria, UA MANY (*)    All other components within normal limits  NOVEL CORONAVIRUS, NAA (HOSP ORDER, SEND-OUT TO REF LAB; TAT 18-24 HRS)  URINE CULTURE    EKG   Radiology No results found.  Procedures Procedures (including critical care time)  Medications Ordered in UC Medications - No data to display  Initial Impression / Assessment and Plan / UC Course  I have reviewed the triage vital signs and the nursing notes.  Pertinent labs & imaging results that were available during my care of the patient were reviewed by me and considered in my medical decision making (see chart for details).    49 yo female presenting with dysuria, foul-smelling urine, lower back  pain, bilateral ear pressure, congestion, facial pain and sinus pressure.  Patient is alert and oriented x3.  Nontoxic-appearing.  Afebrile.  Vital signs stable.  UA shows bacteria but no leukocytes or nitrites.  COVID-19 testing pending.  Will cover with Augmentin.  Supportive care measures advised as well as isolation per CDC guidelines.  Patient agreeable.  Today's evaluation has revealed no signs of a dangerous process. Discussed diagnosis with patient and/or guardian. Patient and/or  guardian aware of their diagnosis, possible red flag symptoms to watch out for and need for close follow up. Patient and/or guardian understands verbal and written discharge instructions. Patient and/or guardian comfortable with plan and disposition.  Patient and/or guardian has a clear mental status at this time, good insight into illness (after discussion and teaching) and has clear judgment to make decisions regarding their care  This care was provided during an unprecedented National Emergency due to the Novel Coronavirus (COVID-19) pandemic. COVID-19 infections and transmission risks place heavy strains on healthcare resources.  As this pandemic evolves, our facility, providers, and staff strive to respond fluidly, to remain operational, and to provide care relative to available resources and information. Outcomes are unpredictable and treatments are without well-defined guidelines. Further, the impact of COVID-19 on all aspects of urgent care, including the impact to patients seeking care for reasons other than COVID-19, is unavoidable during this national emergency. At this time of the global pandemic, management of patients has significantly changed, even for non-COVID positive patients given high local and regional COVID volumes at this time requiring high healthcare system and resource utilization. The standard of care for management of both COVID suspected and non-COVID suspected patients continues to change rapidly  at the local, regional, national, and global levels. This patient was worked up and treated to the best available but ever changing evidence and resources available at this current time.   Documentation was completed with the aid of voice recognition software. Transcription may contain typographical errors.   Final Clinical Impressions(s) / UC Diagnoses   Final diagnoses:  Dysuria  Acute non-recurrent maxillary sinusitis  Encounter for screening for COVID-19     Discharge Instructions     Take medications as prescribed. You may take tylenol or ibuprofen as needed for fevers/headache/body aches. Drink plenty of fluids. Stay in home isolation until you receive results of your COVID test. You will only be notified for positive results. You may go online to MyChart in the next few days and review your results. Please follow CDC guidelines that are attached. You may discontinue home isolation when there has been at least 10 days since symptoms onset AND 3 days fever free without antipyretics (Tylenol or Ibuprofen) AND an overall improvement in your symptoms. Go to the ED immediately if you get worse or have any other symptoms.   Feel better soon!  Aldona Bar, FNP-C      ED Prescriptions    Medication Sig Dispense Auth. Provider   amoxicillin-clavulanate (AUGMENTIN) 875-125 MG tablet Take 1 tablet by mouth every 12 (twelve) hours. 14 tablet Jourdin Connors, Aldona Bar, FNP   fluticasone (FLONASE) 50 MCG/ACT nasal spray Place 2 sprays into both nostrils daily. 16 g Enrique Sack, FNP   ibuprofen (ADVIL) 800 MG tablet Take 1 tablet (800 mg total) by mouth every 8 (eight) hours as needed. 21 tablet Enrique Sack, FNP     PDMP not reviewed this encounter.   Enrique Sack, [place] 06/20/19 1711

## 2019-06-20 NOTE — Discharge Instructions (Signed)
Take medications as prescribed. You may take tylenol or ibuprofen as needed for fevers/headache/body aches. Drink plenty of fluids. Stay in home isolation until you receive results of your COVID test. You will only be notified for positive results. You may go online to MyChart in the next few days and review your results. Please follow CDC guidelines that are attached. You may discontinue home isolation when there has been at least 10 days since symptoms onset AND 3 days fever free without antipyretics (Tylenol or Ibuprofen) AND an overall improvement in your symptoms. Go to the ED immediately if you get worse or have any other symptoms.   Feel better soon!  Rocio Wolak, FNP-C   

## 2019-06-22 ENCOUNTER — Telehealth (HOSPITAL_COMMUNITY): Payer: Self-pay | Admitting: Emergency Medicine

## 2019-06-22 LAB — URINE CULTURE
Culture: >=100000 — AB
Special Requests: NORMAL

## 2019-06-22 LAB — NOVEL CORONAVIRUS, NAA (HOSP ORDER, SEND-OUT TO REF LAB; TAT 18-24 HRS): SARS-CoV-2, NAA: DETECTED — AB

## 2019-06-22 NOTE — Telephone Encounter (Signed)
Your test for COVID-19 was positive, meaning that you were infected with the novel coronavirus and could give the germ to others.  Please continue isolation at home for at least 10 days since the start of your symptoms. If you do not have symptoms, please isolate at home for 10 days from the day you were tested. Once you complete your 10 day quarantine, you may return to normal activities as long as you've not had a fever for over 24 hours(without taking fever reducing medicine) and your symptoms are improving. Please continue good preventive care measures, including:  frequent hand-washing, avoid touching your face, cover coughs/sneezes, stay out of crowds and keep a 6 foot distance from others.  Go to the nearest hospital emergency room if fever/cough/breathlessness are severe or illness seems like a threat to life.  Pt also has uti on urine culture. Antibiotic should cover it  Patient contacted by phone and made aware of    results. Pt verbalized understanding and had all questions answered.

## 2019-10-12 ENCOUNTER — Ambulatory Visit
Admission: EM | Admit: 2019-10-12 | Discharge: 2019-10-12 | Disposition: A | Discharge status: Home / Self Care | Payer: BC Managed Care – PPO | Attending: Family Medicine | Admitting: Family Medicine

## 2019-10-12 ENCOUNTER — Other Ambulatory Visit: Payer: Self-pay

## 2019-10-12 DIAGNOSIS — R21 Rash and other nonspecific skin eruption: Secondary | ICD-10-CM | POA: Diagnosis not present

## 2019-10-12 DIAGNOSIS — M79671 Pain in right foot: Secondary | ICD-10-CM | POA: Diagnosis not present

## 2019-10-12 DIAGNOSIS — M79672 Pain in left foot: Secondary | ICD-10-CM

## 2019-10-12 DIAGNOSIS — M722 Plantar fascial fibromatosis: Secondary | ICD-10-CM | POA: Diagnosis not present

## 2019-10-12 DIAGNOSIS — W57XXXA Bitten or stung by nonvenomous insect and other nonvenomous arthropods, initial encounter: Secondary | ICD-10-CM

## 2019-10-12 MED ORDER — PREDNISONE 10 MG PO TABS: ORAL_TABLET | ORAL | 0 refills | Status: AC

## 2019-10-12 MED ORDER — DOXYCYCLINE HYCLATE 100 MG PO CAPS
100.0000 mg | ORAL_CAPSULE | Freq: Two times a day (BID) | ORAL | 0 refills | Status: AC
Start: 2019-10-12 — End: 2019-10-19

## 2019-10-12 NOTE — ED Triage Notes (Addendum)
Patient states that she was bit yesterday while working outside. She has a bite on her face with swelling and one on right arm that is also swollen and red with tightness.   Patient also has bilateral heel pain worse in left than right x 2 weeks.

## 2019-10-12 NOTE — Discharge Instructions (Signed)
Take medication as prescribed. Rest. Drink plenty of fluids. Over the counter benadryl and pepcid as discussed.   Stretch heels. Wear good supportive shoes. Frozen water bottle. Follow up with podiatry as needed for continued pain.   Follow up with your primary care physician this week as needed. Return to Urgent care for new or worsening concerns.

## 2019-10-12 NOTE — ED Provider Notes (Signed)
[facility] ____________________________________________  Time seen: Approximately 5:18 PM  I have reviewed the triage vital signs and the nursing notes.   HISTORY  Chief Complaint Insect Bite   HPI Ariana Martin is a 49 y.o. female presenting for evaluation of rash and bilateral heel pain.  Patient reports yesterday morning and afternoon she was outside working, cutting hedges and working outside.  States she believes she may have been bitten by insect to her face and her right arm.  Reports since then to those areas she has had redness, some swelling and some itching.  Did take 1 Benadryl last night without resolution.  States she has had an issue with this in the past with insect stings 2 years ago having a reaction.  Denies any difficulties breathing, swallowing, throat swelling, shortness of breath or wheezing.  Denies fevers, chest pain, other rash.  Declines tetanus immunization update today.  Patient also reports bilateral heel pain for the last 2 weeks, left worse than right.  States heel pain is worse without shoes on, present for sitting in the morning and is localized to the heels.  Denies paresthesias, fall, injury, abrupt onset.  States has continued to remain active and has recently increased activity outside on the weekends.  Duanne Limerick, MD : PCP    Past Medical History:  Diagnosis Date  . Allergy     Patient Active Problem List   Diagnosis Date Noted  . Numbness and tingling of leg 12/14/2018  . Baker's cyst of knee, left 12/14/2018  . Varicose veins of both lower extremities with pain 08/20/2017  . Chronic venous insufficiency 08/20/2017  . Bilateral low back pain with left-sided sciatica 02/22/2015  . Lumbosacral disc disease 02/22/2015  . Chest pain 11/30/2013  . 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 VAGINAL HYSTERECTOMY       No current facility-administered  medications for this encounter.  Current Outpatient Medications:  .  fluticasone (FLONASE) 50 MCG/ACT nasal spray, Place 2 sprays into both nostrils daily., Disp: 16 g, Rfl: 0 .  ibuprofen (ADVIL) 800 MG tablet, Take 1 tablet (800 mg total) by mouth every 8 (eight) hours as needed., Disp: 21 tablet, Rfl: 0 .  naproxen sodium (ALEVE) 220 MG tablet, Take by mouth., Disp: , Rfl:  .  omeprazole (PRILOSEC) 40 MG capsule, Take 40 mg by mouth every morning., Disp: , Rfl: 0 .  ALPRAZolam (XANAX) 0.5 MG tablet, TAKE 1ST TABLET 1 HOUR PRIOR TO PROCEDURE AND 2ND TABLETS UPON ARRIVAL TO CLINIC, Disp: , Rfl: 0 .  amoxicillin-clavulanate (AUGMENTIN) 875-125 MG tablet, Take 1 tablet by mouth every 12 (twelve) hours., Disp: 14 tablet, Rfl: 0 .  Cetirizine HCl (ZYRTEC ALLERGY) 10 MG CAPS, Take by mouth., Disp: , Rfl:  .  doxycycline (VIBRAMYCIN) 100 MG capsule, Take 1 capsule (100 mg total) by mouth 2 (two) times daily for 7 days., Disp: 14 capsule, Rfl: 0 .  EPINEPHrine 0.3 mg/0.3 mL IJ SOAJ injection, Inject 0.3 mg into the muscle., Disp: , Rfl:  .  pantoprazole (PROTONIX) 40 MG tablet, Take 1 tablet (40 mg total) by mouth daily., Disp: 30 tablet, Rfl: 0 .  phentermine (ADIPEX-P) 37.5 MG tablet, TAKE 1 TO 1 AND 1/2 TABLETS EVERY MORNING, Disp: , Rfl: 0 .  predniSONE (DELTASONE) 10 MG tablet, Take 60mg  orally day one, then 55 mg orally day two, then 50 mg orally day three, then taper by  5 mg daily over 12 days until complete., Disp: 35 tablet, Rfl: 0  Allergies Sulfa antibiotics  Family History  Problem Relation Age of Onset  . Hypertension Mother   . Hyperlipidemia Mother   . Vascular Disease Father   . Heart disease Father     Social History Social History   Tobacco Use  . Smoking status: Former Games developer  . Smokeless tobacco: Never Used  Substance Use Topics  . Alcohol use: Yes    Alcohol/week: 0.0 standard drinks  . Drug use: No    Review of Systems Constitutional: No fever/chills Eyes: No  visual changes. ENT: No sore throat. Cardiovascular: Denies chest pain. Respiratory: Denies shortness of breath. Gastrointestinal: No abdominal pain.  Musculoskeletal: Positive heel pain.  Skin: Positive for rash. Neurological: Negative for focal weakness or numbness.   ____________________________________________   PHYSICAL EXAM:  VITAL SIGNS: ED Triage Vitals  Enc Vitals Group     BP 10/12/19 1634 125/60     Pulse Rate 10/12/19 1634 81     Resp 10/12/19 1634 18     Temp 10/12/19 1634 97.8 F (36.6 C)     Temp Source 10/12/19 1634 Oral     SpO2 10/12/19 1634 100 %     Weight 10/12/19 1631 168 lb (76.2 kg)     Height 10/12/19 1631 5\' 6"  (1.676 m)     Head Circumference --      Peak Flow --      Pain Score 10/12/19 1630 7     Pain Loc --      Pain Edu? --      Excl. in GC? --     Constitutional: Alert and oriented. Well appearing and in no acute distress. Eyes: Conjunctivae are normal. PERRL. EOMI. No pain with EOMs.  ENT      Head: Normocephalic.      Nose: No congestion. Cardiovascular: Normal rate, regular rhythm. Grossly normal heart sounds.  Good peripheral circulation. Respiratory: Normal respiratory effort without tachypnea nor retractions. Breath sounds are clear and equal bilaterally. No wheezes, rales, rhonchi. Musculoskeletal: Bilateral pedal pulses equal and easily palpated.  Right plantar heel mild tenderness to palpation, no swelling, no bony tenderness, full range of motion present, normal to sensation capillary refill.  Left plantar heel moderate tenderness to direct palpation without edema or erythema, mild pain with flexion and extension, no point bony tenderness, no Achilles tenderness.  Ambulatory with mild antalgic gait.  Bilateral distal pedal pulses equal. Neurologic:  Normal speech and language. No gross focal neurologic deficits are appreciated. Speech is normal. No gait instability.  Skin:  Skin is warm, dry.  Except: Right posterior forearm with  area of erythema with centered punctum, warm to touch, mildly tender and pruritic, no drainage.  Right cheek and inner nasal bridge small area of erythema with minimal edema and papule, pruritic, no drainage. Psychiatric: Mood and affect are normal. Speech and behavior are normal. Patient exhibits appropriate insight and judgment   ___________________________________________   LABS (all labs ordered are listed, but only abnormal results are displayed)  Labs Reviewed - No data to display ____________________________________________   PROCEDURES Procedures   INITIAL IMPRESSION / ASSESSMENT AND PLAN / ED COURSE  Pertinent labs & imaging results that were available during my care of the patient were reviewed by me and considered in my medical decision making (see chart for details).  Well-appearing patient.  No acute distress.  Suspect local allergic reaction from insect bite or sting, also potential concern  for facial contact dermatitis from plant.  Suspect bilateral plantar fasciitis, will defer x-ray and patient agrees to this.  Stretching, good supportive shoes, ice, follow-up with podiatry as needed for continued pain.  Will treat with prednisone taper and provided with doxycycline.  Patient declined tetanus immunization.  Supportive care.  Monitoring.  Over-the-counter Benadryl and Pepcid.Discussed indication, risks and benefits of medications with patient.   Discussed follow up with Primary care physician this week. Discussed follow up and return parameters including no resolution or any worsening concerns. Patient verbalized understanding and agreed to plan.   ____________________________________________   FINAL CLINICAL IMPRESSION(S) / ED DIAGNOSES  Final diagnoses:  Insect bite, unspecified site, initial encounter  Rash  Heel pain, bilateral  Plantar fasciitis, bilateral     ED Discharge Orders         Ordered    predniSONE (DELTASONE) 10 MG tablet     10/12/19 1704     doxycycline (VIBRAMYCIN) 100 MG capsule  2 times daily     10/12/19 1704           Note: This dictation was prepared with Dragon dictation along with smaller phrase technology. Any transcriptional errors that result from this process are unintentional.         Marylene Land, NP 10/12/19 1731

## 2019-10-19 DIAGNOSIS — Z13220 Encounter for screening for lipoid disorders: Secondary | ICD-10-CM | POA: Diagnosis not present

## 2019-10-19 DIAGNOSIS — Z01419 Encounter for gynecological examination (general) (routine) without abnormal findings: Secondary | ICD-10-CM | POA: Diagnosis not present

## 2019-10-19 DIAGNOSIS — R635 Abnormal weight gain: Secondary | ICD-10-CM | POA: Diagnosis not present

## 2019-10-19 DIAGNOSIS — Z1321 Encounter for screening for nutritional disorder: Secondary | ICD-10-CM | POA: Diagnosis not present

## 2019-10-19 DIAGNOSIS — Z1329 Encounter for screening for other suspected endocrine disorder: Secondary | ICD-10-CM | POA: Diagnosis not present

## 2019-10-19 DIAGNOSIS — Z131 Encounter for screening for diabetes mellitus: Secondary | ICD-10-CM | POA: Diagnosis not present

## 2019-11-17 DIAGNOSIS — R635 Abnormal weight gain: Secondary | ICD-10-CM | POA: Diagnosis not present

## 2019-12-07 DIAGNOSIS — M722 Plantar fascial fibromatosis: Secondary | ICD-10-CM | POA: Diagnosis not present

## 2020-02-29 DIAGNOSIS — M722 Plantar fascial fibromatosis: Secondary | ICD-10-CM | POA: Diagnosis not present

## 2020-03-05 ENCOUNTER — Other Ambulatory Visit: Payer: Self-pay | Admitting: Orthopedic Surgery

## 2020-03-05 DIAGNOSIS — M1712 Unilateral primary osteoarthritis, left knee: Secondary | ICD-10-CM | POA: Diagnosis not present

## 2020-03-05 DIAGNOSIS — M2392 Unspecified internal derangement of left knee: Secondary | ICD-10-CM | POA: Diagnosis not present

## 2020-03-05 DIAGNOSIS — M25562 Pain in left knee: Secondary | ICD-10-CM | POA: Diagnosis not present

## 2020-03-05 DIAGNOSIS — M25462 Effusion, left knee: Secondary | ICD-10-CM | POA: Diagnosis not present

## 2020-03-06 ENCOUNTER — Ambulatory Visit
Admission: RE | Admit: 2020-03-06 | Discharge: 2020-03-06 | Disposition: A | Discharge status: Home / Self Care | Payer: BC Managed Care – PPO | Source: Ambulatory Visit | Attending: Orthopedic Surgery | Admitting: Orthopedic Surgery

## 2020-03-06 ENCOUNTER — Other Ambulatory Visit: Payer: Self-pay

## 2020-03-06 DIAGNOSIS — M25562 Pain in left knee: Secondary | ICD-10-CM | POA: Insufficient documentation

## 2020-03-06 DIAGNOSIS — M7122 Synovial cyst of popliteal space [Baker], left knee: Secondary | ICD-10-CM | POA: Diagnosis not present

## 2020-03-06 DIAGNOSIS — S8992XA Unspecified injury of left lower leg, initial encounter: Secondary | ICD-10-CM | POA: Diagnosis not present

## 2020-03-06 DIAGNOSIS — M25462 Effusion, left knee: Secondary | ICD-10-CM | POA: Diagnosis not present

## 2020-03-06 DIAGNOSIS — M1712 Unilateral primary osteoarthritis, left knee: Secondary | ICD-10-CM | POA: Diagnosis not present

## 2020-03-07 DIAGNOSIS — E559 Vitamin D deficiency, unspecified: Secondary | ICD-10-CM | POA: Diagnosis not present

## 2020-03-07 DIAGNOSIS — Z01419 Encounter for gynecological examination (general) (routine) without abnormal findings: Secondary | ICD-10-CM | POA: Diagnosis not present

## 2020-03-08 DIAGNOSIS — M25462 Effusion, left knee: Secondary | ICD-10-CM | POA: Diagnosis not present

## 2020-03-08 DIAGNOSIS — M1712 Unilateral primary osteoarthritis, left knee: Secondary | ICD-10-CM | POA: Diagnosis not present

## 2020-03-08 DIAGNOSIS — M25562 Pain in left knee: Secondary | ICD-10-CM | POA: Diagnosis not present

## 2020-03-26 DIAGNOSIS — M25562 Pain in left knee: Secondary | ICD-10-CM | POA: Diagnosis not present

## 2020-03-26 DIAGNOSIS — M222X2 Patellofemoral disorders, left knee: Secondary | ICD-10-CM | POA: Diagnosis not present

## 2020-03-26 DIAGNOSIS — R262 Difficulty in walking, not elsewhere classified: Secondary | ICD-10-CM | POA: Diagnosis not present

## 2020-04-04 DIAGNOSIS — M222X2 Patellofemoral disorders, left knee: Secondary | ICD-10-CM | POA: Diagnosis not present

## 2020-04-04 DIAGNOSIS — M25562 Pain in left knee: Secondary | ICD-10-CM | POA: Diagnosis not present

## 2020-04-04 DIAGNOSIS — R262 Difficulty in walking, not elsewhere classified: Secondary | ICD-10-CM | POA: Diagnosis not present

## 2020-04-06 DIAGNOSIS — R262 Difficulty in walking, not elsewhere classified: Secondary | ICD-10-CM | POA: Diagnosis not present

## 2020-04-06 DIAGNOSIS — M25562 Pain in left knee: Secondary | ICD-10-CM | POA: Diagnosis not present

## 2020-04-06 DIAGNOSIS — M222X2 Patellofemoral disorders, left knee: Secondary | ICD-10-CM | POA: Diagnosis not present

## 2021-07-16 ENCOUNTER — Other Ambulatory Visit: Payer: Self-pay | Admitting: Obstetrics and Gynecology

## 2021-07-16 DIAGNOSIS — Z1231 Encounter for screening mammogram for malignant neoplasm of breast: Secondary | ICD-10-CM

## 2021-08-06 ENCOUNTER — Other Ambulatory Visit: Payer: Self-pay

## 2021-08-06 ENCOUNTER — Ambulatory Visit
Admission: RE | Admit: 2021-08-06 | Discharge: 2021-08-06 | Disposition: A | Discharge status: Home / Self Care | Payer: BC Managed Care – PPO | Source: Ambulatory Visit | Attending: Obstetrics and Gynecology | Admitting: Obstetrics and Gynecology

## 2021-08-06 DIAGNOSIS — Z1231 Encounter for screening mammogram for malignant neoplasm of breast: Secondary | ICD-10-CM | POA: Insufficient documentation

## 2021-08-07 ENCOUNTER — Inpatient Hospital Stay
Admission: RE | Admit: 2021-08-07 | Discharge: 2021-08-07 | Disposition: A | Discharge status: Home / Self Care | Payer: Self-pay | Source: Ambulatory Visit | Attending: *Deleted | Admitting: *Deleted

## 2021-08-07 ENCOUNTER — Other Ambulatory Visit: Payer: Self-pay | Admitting: *Deleted

## 2021-08-07 DIAGNOSIS — Z1231 Encounter for screening mammogram for malignant neoplasm of breast: Secondary | ICD-10-CM

## 2022-04-14 ENCOUNTER — Encounter (INDEPENDENT_AMBULATORY_CARE_PROVIDER_SITE_OTHER): Payer: Self-pay

## 2022-12-01 LAB — EXTERNAL GENERIC LAB PROCEDURE: COLOGUARD: NEGATIVE

## 2023-06-29 ENCOUNTER — Other Ambulatory Visit: Payer: Self-pay | Admitting: Internal Medicine

## 2023-06-29 DIAGNOSIS — Z1231 Encounter for screening mammogram for malignant neoplasm of breast: Secondary | ICD-10-CM

## 2023-07-07 ENCOUNTER — Ambulatory Visit
Admission: RE | Admit: 2023-07-07 | Discharge: 2023-07-07 | Disposition: A | Discharge status: Home / Self Care | Payer: No Typology Code available for payment source | Source: Ambulatory Visit | Attending: Internal Medicine | Admitting: Internal Medicine

## 2023-07-07 DIAGNOSIS — Z1231 Encounter for screening mammogram for malignant neoplasm of breast: Secondary | ICD-10-CM | POA: Diagnosis present
